# Patient Record
Sex: Female | Born: 1985 | Race: White | Hispanic: No | Smoking: Current every day smoker
Health system: Southern US, Community
[De-identification: ages and names within clinical notes are randomized; demographics above are authoritative.]

## PROBLEM LIST (undated history)

## (undated) DIAGNOSIS — R0789 Other chest pain: Secondary | ICD-10-CM

## (undated) DIAGNOSIS — E119 Type 2 diabetes mellitus without complications: Secondary | ICD-10-CM

## (undated) DIAGNOSIS — R5383 Other fatigue: Secondary | ICD-10-CM

## (undated) DIAGNOSIS — F99 Mental disorder, not otherwise specified: Secondary | ICD-10-CM

## (undated) DIAGNOSIS — I10 Essential (primary) hypertension: Secondary | ICD-10-CM

## (undated) DIAGNOSIS — K219 Gastro-esophageal reflux disease without esophagitis: Secondary | ICD-10-CM

## (undated) DIAGNOSIS — F419 Anxiety disorder, unspecified: Secondary | ICD-10-CM

## (undated) HISTORY — DX: Other chest pain: R07.89

## (undated) HISTORY — DX: Gastro-esophageal reflux disease without esophagitis: K21.9

## (undated) HISTORY — DX: Other fatigue: R53.83

---
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
## 2017-01-09 NOTE — L&D Delivery Note (Addendum)
Patient: Cassandra Marsh MRN: [id number]  GBS status: Negative  Patient is a 32 y.o. now G1P1001 s/p NSVD at [redacted]w[redacted]d, who was admitted for IOL due to [facility] and A2GDM. AROM 8h 54m prior to delivery with clear fluid.    Delivery Note At 11:19 PM a viable female was delivered via Vaginal, Spontaneous (Presentation: L OA).  APGAR: 8, 9; weight pending.   Placenta status: Spontaneous, intact Cord: 3 vessel, shorter than expected with the following complications: none.    Anesthesia: Epidural  Episiotomy: No Lacerations: None   Suture Repair: none Est. Blood Loss (mL): 314  Head delivered left OA. No nuchal cord present. Shoulder and body delivered in usual fashion. Infant with spontaneous cry, placed on mother's abdomen, dried and bulb suctioned. Cord clamped x 2 after 1-minute delay, and cut by family member. Cord blood drawn. Placenta delivered spontaneously with gentle cord traction. Fundus firm with massage and Pitocin. Perineum inspected and found to have no lacerations. Continued to have slow tickle of blood, provided TXA. Cessation of bleeding prior to leaving room.    Mom to postpartum.  Baby to Couplet care / Skin to Skin.  Allayne Stack 11/16/2017, 12:08 AM   OB FELLOW DELIVERY ATTESTATION  I was gloved and present for the delivery in its entirety, and I agree with the above resident's note.    Gwenevere Abbot, MD OB Fellow  11/16/2017, 4:58 AM

## 2017-05-22 ENCOUNTER — Ambulatory Visit (INDEPENDENT_AMBULATORY_CARE_PROVIDER_SITE_OTHER): Payer: Medicaid Other | Admitting: Obstetrics & Gynecology

## 2017-05-22 ENCOUNTER — Encounter: Payer: Self-pay | Admitting: Obstetrics & Gynecology

## 2017-05-22 ENCOUNTER — Other Ambulatory Visit (HOSPITAL_COMMUNITY)
Admission: RE | Admit: 2017-05-22 | Discharge: 2017-05-22 | Disposition: A | Discharge status: Home / Self Care | Payer: Medicaid Other | Source: Ambulatory Visit | Attending: Obstetrics & Gynecology | Admitting: Obstetrics & Gynecology

## 2017-05-22 DIAGNOSIS — Z3400 Encounter for supervision of normal first pregnancy, unspecified trimester: Secondary | ICD-10-CM | POA: Diagnosis present

## 2017-05-22 DIAGNOSIS — Z3401 Encounter for supervision of normal first pregnancy, first trimester: Secondary | ICD-10-CM | POA: Insufficient documentation

## 2017-05-22 DIAGNOSIS — Z3481 Encounter for supervision of other normal pregnancy, first trimester: Secondary | ICD-10-CM

## 2017-05-22 MED ORDER — PROMETHAZINE HCL 12.5 MG PO TABS: 12.5000 mg | ORAL_TABLET | Freq: Three times a day (TID) | ORAL | 1 refills | Status: DC | PRN

## 2017-05-22 NOTE — Progress Notes (Signed)
Patient is in the office for initial ob visit, unplanned pregnancy, unsure of fob. Pt states her friend Ethelene Browns is her support person.

## 2017-05-22 NOTE — Progress Notes (Signed)
  Subjective:    Cassandra Marsh is a 32 yo single G1P0 [redacted]w[redacted]d being seen today for her first obstetrical visit.  Her obstetrical history is significant for none. Patient does intend to breast feed. Pregnancy history fully reviewed.  Patient reports no complaints.  There were no vitals filed for this visit.  HISTORY: OB History  Gravida Para Term Preterm AB Living  1            SAB TAB Ectopic Multiple Live Births               # Outcome Date GA Lbr Len/2nd Weight Sex Delivery Anes PTL Lv  1 Current            History reviewed. No pertinent past medical history. Past Surgical History:  Procedure Laterality Date  . TONSILLECTOMY  2004   History reviewed. No pertinent family history.   Exam    Uterus:     Pelvic Exam:    Perineum: No Hemorrhoids   Vulva: normal   Vagina:  normal mucosa   pH:    Cervix: anteverted   Adnexa: normal adnexa   Bony Pelvis: android  System: Breast:  normal appearance, no masses or tenderness   Skin: normal coloration and turgor, no rashes    Neurologic: oriented   Extremities: normal strength, tone, and muscle mass   HEENT PERRLA   Mouth/Teeth mucous membranes moist, pharynx normal without lesions   Neck supple   Cardiovascular: regular rate and rhythm   Respiratory:  appears well, vitals normal, no respiratory distress, acyanotic, normal RR, ear and throat exam is normal, neck free of mass or lymphadenopathy, chest clear, no wheezing, crepitations, rhonchi, normal symmetric air entry   Abdomen: soft, non-tender; bowel sounds normal; no masses,  no organomegaly   Urinary: urethral meatus normal      Assessment:    Pregnancy: G1P0 Patient Active Problem List   Diagnosis Date Noted  . Supervision of normal first pregnancy, antepartum 05/22/2017        Plan:     Initial labs drawn. Prenatal vitamins. Problem list reviewed and updated. Genetic Screening discussed: opts for Panorama, to be done today  Ultrasound discussed; fetal  survey: ordered.  Follow up in 8 weeks. She will do [employer] (optimized schedule) Phenergan prn for mild nausea   Allie Bossier 05/22/2017

## 2017-05-24 LAB — CYTOLOGY - PAP
ADEQUACY: ABSENT
Chlamydia: NEGATIVE
DIAGNOSIS: NEGATIVE
HPV (WINDOPATH): NOT DETECTED
NEISSERIA GONORRHEA: NEGATIVE

## 2017-05-25 LAB — OBSTETRIC PANEL, INCLUDING HIV
Antibody Screen: NEGATIVE
Basophils Absolute: 0 10*3/uL (ref 0.0–0.2)
Basos: 0 %
EOS (ABSOLUTE): 0.1 10*3/uL (ref 0.0–0.4)
Eos: 1 %
HIV Screen 4th Generation wRfx: NONREACTIVE
Hematocrit: 43.4 % (ref 34.0–46.6)
Hemoglobin: 14.7 g/dL (ref 11.1–15.9)
Hepatitis B Surface Ag: NEGATIVE
Immature Grans (Abs): 0 10*3/uL (ref 0.0–0.1)
Immature Granulocytes: 0 %
Lymphocytes Absolute: 2 10*3/uL (ref 0.7–3.1)
Lymphs: 19 %
MCH: 31.7 pg (ref 26.6–33.0)
MCHC: 33.9 g/dL (ref 31.5–35.7)
MCV: 94 fL (ref 79–97)
Monocytes Absolute: 0.4 10*3/uL (ref 0.1–0.9)
Monocytes: 4 %
Neutrophils Absolute: 8.2 10*3/uL — ABNORMAL HIGH (ref 1.4–7.0)
Neutrophils: 76 %
Platelets: 311 10*3/uL (ref 150–379)
RBC: 4.63 x10E6/uL (ref 3.77–5.28)
RDW: 13.1 % (ref 12.3–15.4)
RPR Ser Ql: NONREACTIVE
Rh Factor: POSITIVE
Rubella Antibodies, IGG: 3.29 index (ref >0.99)
WBC: 10.7 10*3/uL (ref 3.4–10.8)

## 2017-05-25 LAB — CULTURE, OB URINE

## 2017-05-25 LAB — HEMOGLOBINOPATHY EVALUATION
HGB C: 0 %
HGB S: 0 %
HGB VARIANT: 0 %
Hemoglobin A2 Quantitation: 2.2 % (ref 1.8–3.2)
Hemoglobin F Quantitation: 0 % (ref 0.0–2.0)
Hgb A: 97.8 % (ref 96.4–98.8)

## 2017-05-25 LAB — URINE CULTURE, OB REFLEX

## 2017-05-28 ENCOUNTER — Telehealth: Payer: Self-pay

## 2017-05-28 NOTE — Telephone Encounter (Signed)
Left VM message to call office.  Need to verify receipt of BRX Kit and activation.

## 2017-05-28 NOTE — Telephone Encounter (Signed)
-----  Message from Gretchen Short, [place] sent at 05/28/2017  9:46 AM EDT ----- Pt has completed her BRx enrollment but needs to activate equipment. Please call her to see if she has received her kit.   Thanks [employer]

## 2017-05-28 NOTE — Telephone Encounter (Signed)
Patient returned our call, she advised [place] that she just received her Kit today. She promised to activate and use.

## 2017-05-29 ENCOUNTER — Encounter: Payer: Self-pay | Admitting: Obstetrics & Gynecology

## 2017-05-29 LAB — CYSTIC FIBROSIS MUTATION 97: GENE DIS ANAL CARRIER INTERP BLD/T-IMP: NOT DETECTED

## 2017-05-29 NOTE — Addendum Note (Signed)
Addended by: Reva Bores on: 05/29/2017 05:09 PM   Modules accepted: Kipp Brood

## 2017-06-06 ENCOUNTER — Encounter: Payer: Self-pay | Admitting: Obstetrics & Gynecology

## 2017-06-17 ENCOUNTER — Encounter: Payer: Self-pay | Admitting: Obstetrics & Gynecology

## 2017-06-18 ENCOUNTER — Encounter: Payer: Self-pay | Admitting: Obstetrics & Gynecology

## 2017-06-19 ENCOUNTER — Encounter: Payer: Self-pay | Admitting: Obstetrics & Gynecology

## 2017-06-19 ENCOUNTER — Ambulatory Visit (INDEPENDENT_AMBULATORY_CARE_PROVIDER_SITE_OTHER): Payer: Medicaid Other | Admitting: Obstetrics & Gynecology

## 2017-06-19 VITALS — BP 124/85 | HR 94 | Wt 121.8 lb

## 2017-06-19 DIAGNOSIS — O99330 Smoking (tobacco) complicating pregnancy, unspecified trimester: Secondary | ICD-10-CM | POA: Insufficient documentation

## 2017-06-19 DIAGNOSIS — F17200 Nicotine dependence, unspecified, uncomplicated: Secondary | ICD-10-CM | POA: Diagnosis not present

## 2017-06-19 DIAGNOSIS — O99332 Smoking (tobacco) complicating pregnancy, second trimester: Secondary | ICD-10-CM | POA: Diagnosis not present

## 2017-06-19 DIAGNOSIS — Z3400 Encounter for supervision of normal first pregnancy, unspecified trimester: Secondary | ICD-10-CM

## 2017-06-19 DIAGNOSIS — Z3402 Encounter for supervision of normal first pregnancy, second trimester: Secondary | ICD-10-CM

## 2017-06-19 NOTE — Progress Notes (Signed)
Pt complains of not feeling fetal movement for the last 4 days.

## 2017-06-19 NOTE — Patient Instructions (Signed)
Coping with Quitting Smoking Quitting smoking is a physical and mental challenge. You will face cravings, withdrawal symptoms, and temptation. Before quitting, work with your health care provider to make a plan that can help you cope. Preparation can help you quit and keep you from giving in. How can I cope with cravings? Cravings usually last for 5-10 minutes. If you get through it, the craving will pass. Consider taking the following actions to help you cope with cravings:  Keep your mouth busy: ? Chew sugar-free gum. ? Suck on hard candies or a straw. ? Brush your teeth.  Keep your hands and body busy: ? Immediately change to a different activity when you feel a craving. ? Squeeze or play with a ball. ? Do an activity or a hobby, like making bead jewelry, practicing needlepoint, or working with wood. ? Mix up your normal routine. ? Take a short exercise break. Go for a quick walk or run up and down stairs. ? Spend time in public places where smoking is not allowed.  Focus on doing something kind or helpful for someone else.  Call a friend or family member to talk during a craving.  Join a support group.  Call a quit line, such as 1-800-QUIT-NOW.  Talk with your health care provider about medicines that might help you cope with cravings and make quitting easier for you.  How can I deal with withdrawal symptoms? Your body may experience negative effects as it tries to get used to not having nicotine in the system. These effects are called withdrawal symptoms. They may include:  Feeling hungrier than normal.  Trouble concentrating.  Irritability.  Trouble sleeping.  Feeling depressed.  Restlessness and agitation.  Craving a cigarette.  To manage withdrawal symptoms:  Avoid places, people, and activities that trigger your cravings.  Remember why you want to quit.  Get plenty of sleep.  Avoid coffee and other caffeinated drinks. These may worsen some of your  symptoms.  How can I handle social situations? Social situations can be difficult when you are quitting smoking, especially in the first few weeks. To manage this, you can:  Avoid parties, bars, and other social situations where people might be smoking.  Avoid alcohol.  Leave right away if you have the urge to smoke.  Explain to your family and friends that you are quitting smoking. Ask for understanding and support.  Plan activities with friends or family where smoking is not an option.  What are some ways I can cope with stress? Wanting to smoke may cause stress, and stress can make you want to smoke. Find ways to manage your stress. Relaxation techniques can help. For example:  Breathe slowly and deeply, in through your nose and out through your mouth.  Listen to soothing, relaxing music.  Talk with a family member or friend about your stress.  Light a candle.  Soak in a bath or take a shower.  Think about a peaceful place.  What are some ways I can prevent weight gain? Be aware that many people gain weight after they quit smoking. However, not everyone does. To keep from gaining weight, have a plan in place before you quit and stick to the plan after you quit. Your plan should include:  Having healthy snacks. When you have a craving, it may help to: ? Eat plain popcorn, crunchy carrots, celery, or other cut vegetables. ? Chew sugar-free gum.  Changing how you eat: ? Eat small portion sizes at meals. ?   Eat 4-6 small meals throughout the day instead of 1-2 large meals a day. ? Be mindful when you eat. Do not watch television or do other things that might distract you as you eat.  Exercising regularly: ? Make time to exercise each day. If you do not have time for a long workout, do short bouts of exercise for 5-10 minutes several times a day. ? Do some form of strengthening exercise, like weight lifting, and some form of aerobic exercise, like running or  swimming.  Drinking plenty of water or other low-calorie or no-calorie drinks. Drink 6-8 glasses of water daily, or as much as instructed by your health care provider.  Summary  Quitting smoking is a physical and mental challenge. You will face cravings, withdrawal symptoms, and temptation to smoke again. Preparation can help you as you go through these challenges.  You can cope with cravings by keeping your mouth busy (such as by chewing gum), keeping your body and hands busy, and making calls to family, friends, or a helpline for people who want to quit smoking.  You can cope with withdrawal symptoms by avoiding places where people smoke, avoiding drinks with caffeine, and getting plenty of rest.  Ask your health care provider about the different ways to prevent weight gain, avoid stress, and handle social situations. This information is not intended to replace advice given to you by your health care provider. Make sure you discuss any questions you have with your health care provider. Document Released: 12/24/2015 Document Revised: 12/24/2015 Document Reviewed: 12/24/2015 Elsevier Interactive Patient Education  2018 [employer]. Smoking During Pregnancy Smoking during pregnancy is unhealthy for you and your baby. Smoke from cigarettes, pipes, and cigars contains many chemicals that can cause cancer (carcinogens). Cigarettes also contain a stimulant drug (nicotine). When you smoke, harmful substances that you breathe in enter your bloodstream and can be passed on to your baby. This can affect your baby's development. If you are planning to become pregnant or have recently become pregnant, talk with your health care provider about quitting smoking. How does smoking affect me? Smoking increases your risk for many long-term (chronic) diseases. These diseases include cancer, lung diseases, and heart disease. Smoking during pregnancy increases your risk of:  Losing the pregnancy (miscarriage or  stillbirth).  Giving birth too early (premature birth).  Pregnancy outside of the uterus (tubal pregnancy).  Having problems with the organ that provides the baby nourishment and oxygen (placenta), including: ? Attachment of the placenta over the opening of the uterus (placenta previa). ? Detachment of the placenta before the baby's birth (placental abruption).  Having your water break before labor begins (premature rupture of membranes).  How does smoking affect my baby? Before Birth Smoking during pregnancy:  Decreases blood flow and oxygen to your baby.  Increases your baby's risk of birth defects, such as heart defects.  Increases your baby's heart rate.  Slows your baby's growth in the uterus (intrauterine growth retardation).  After Birth Babies born to women who smoked during pregnancy may:  Have symptoms of nicotine withdrawal.  Need to stay in the hospital for special care.  May be too small at birth.  Have a high risk of: ? Serious health problems or lifelong disabilities. ? Sudden infant death syndrome (SIDS). ? Becoming obese. ? Developing behavior or learning problems.  What can happen if changes are not made? When babies are born with a birth defect or illness, they often need to stay in the hospital longer before  going home. Hospital stays may also be longer if you had any complications during labor or delivery. Longer hospital stays and more treatments result in higher costs for health care. Many health issues among babies born to mothers who smoke can have a lifelong impact. This may include the long-term need for certain medicines, therapies, or other treatments. What are the benefits of not smoking during pregnancy? You have a much better chance of having a healthy pregnancy and a healthy baby if you do not smoke while you are pregnant. Not smoking also means that you will have a better chance of living a long and healthy life, and your baby will have a  better chance of growing into a healthy child and adult. What actions can be taken? Quitting smoking can be difficult. Ask your health care provider for help to stop smoking. You may also consider:  Counseling to help you quit smoking (smoking cessation counseling).  Psychotherapy.  Acupuncture.  Hypnosis.  Telephone [employer] Products[employer]IT hotlines.  If these methods do not help you, talk with your health care provider about other options. Do not take smoking cessation medicines or nicotine supplements unless your health care provider tells you to. Where to find more information: Learn more about smoking during pregnancy and quitting smoking from:  March of Dimes: www.marchofdimes.org/pregnancy/smoking-during-pregnancy.aspx  U.S. Department of Health and Human Services: women.smokefree.gov  American Cancer Society: www.cancer.org  American Heart Association: www.heart.org  National Cancer Institute: www.cancer.gov  For help to quit smoking:  National smoking cessation telephone hotline: 1-800-QUIT NOW 269-457-6300(872-494-3159)  Contact a health care provider if:  You are struggling to quit smoking.  You are a smoker and you become pregnant or plan to become pregnant.  You start smoking again after giving birth. Summary  Tobacco smoke contains harmful substances that can affect a baby's health and development.  Smoking increases the risk for serious problems, such as miscarriage, birth defects, or premature birth.  If you need help to quit smoking, ask your health care provider. This information is not intended to replace advice given to you by your health care provider. Make sure you discuss any questions you have with your health care provider. Document Released: 05/09/2004 Document Revised: 10/15/2015 Document Reviewed: 10/15/2015 Elsevier Interactive Patient Education  2018 [employer]

## 2017-06-19 NOTE — Progress Notes (Signed)
   PRENATAL VISIT NOTE  Subjective:  Cassandra Marsh is a 32 y.o. G1P0 at 169w6d being seen today for ongoing prenatal care.  Cassandra Marsh is currently monitored for the following issues for this high-risk pregnancy and has Supervision of normal first pregnancy, antepartum and Tobacco smoking affecting pregnancy, antepartum on their problem list.  Patient reports mid abdominal pain and decreased movement, irritible.  Contractions: Not present. Vag. Bleeding: None.  Movement: Present. Denies leaking of fluid.   The following portions of the patient's history were reviewed and updated as appropriate: allergies, current medications, past family history, past medical history, past social history, past surgical history and problem list. Problem list updated.  Objective:   Vitals:   06/19/17 1338  BP: 124/85  Pulse: 94  Weight: 121 lb 12.8 oz (55.2 kg)    Fetal Status: Fetal Heart Rate (bpm): 150   Movement: Present     General:  Alert, oriented and cooperative. Patient is in no acute distress.  Skin: Skin is warm and dry. No rash noted.   Cardiovascular: Normal heart rate noted  Respiratory: Normal respiratory effort, no problems with respiration noted  Abdomen: Soft, gravid, appropriate for gestational age.  Pain/Pressure: Absent     Pelvic: Cervical exam deferred        Extremities: Normal range of motion.  Edema: None  Mental Status: Normal mood and affect. Normal behavior. Normal judgment and thought content.   Assessment and Plan:  Pregnancy: G1P0 at 3469w6d  1. Supervision of normal first pregnancy, antepartum Reassurance given, advised her that mental health counseling is available  2. Tobacco smoking affecting pregnancy, antepartum Urged quit, materials given and 5 min counseling spent  Preterm labor symptoms and general obstetric precautions including but not limited to vaginal bleeding, contractions, leaking of fluid and fetal movement were reviewed in detail with the patient. Please  refer to After Visit Summary for other counseling recommendations.  Return in about 1 month (around 07/17/2017).  Future Appointments  Date Time Provider Department Center  07/11/2017  1:45 PM [facility] US 2 [facility]  07/17/2017  1:00 PM Conan Bowensavis, Kelly M, MD [facility] None    Scheryl DarterJames Rennee Coyne, MD

## 2017-07-02 ENCOUNTER — Encounter: Payer: Self-pay | Admitting: Obstetrics & Gynecology

## 2017-07-04 ENCOUNTER — Encounter (HOSPITAL_COMMUNITY): Payer: Self-pay

## 2017-07-08 ENCOUNTER — Encounter: Payer: Self-pay | Admitting: Obstetrics & Gynecology

## 2017-07-11 ENCOUNTER — Other Ambulatory Visit: Payer: Self-pay | Admitting: Obstetrics & Gynecology

## 2017-07-11 ENCOUNTER — Telehealth: Payer: Self-pay

## 2017-07-11 ENCOUNTER — Ambulatory Visit (HOSPITAL_COMMUNITY)
Admission: RE | Admit: 2017-07-11 | Discharge: 2017-07-11 | Disposition: A | Discharge status: Home / Self Care | Payer: Medicaid Other | Source: Ambulatory Visit | Attending: Obstetrics & Gynecology | Admitting: Obstetrics & Gynecology

## 2017-07-11 ENCOUNTER — Encounter: Payer: Self-pay | Admitting: Obstetrics & Gynecology

## 2017-07-11 ENCOUNTER — Other Ambulatory Visit (HOSPITAL_COMMUNITY): Payer: Self-pay | Admitting: *Deleted

## 2017-07-11 DIAGNOSIS — Z363 Encounter for antenatal screening for malformations: Secondary | ICD-10-CM

## 2017-07-11 DIAGNOSIS — O99332 Smoking (tobacco) complicating pregnancy, second trimester: Secondary | ICD-10-CM | POA: Insufficient documentation

## 2017-07-11 DIAGNOSIS — Z3A19 19 weeks gestation of pregnancy: Secondary | ICD-10-CM

## 2017-07-11 DIAGNOSIS — Z3400 Encounter for supervision of normal first pregnancy, unspecified trimester: Secondary | ICD-10-CM

## 2017-07-11 DIAGNOSIS — Z362 Encounter for other antenatal screening follow-up: Secondary | ICD-10-CM

## 2017-07-11 NOTE — Telephone Encounter (Signed)
This pt is a [facility] patient. [facility]. Pt has sent another message saying she is in pain and no one has responded. I called the pt but, she did not answer and her voicemail box was full.

## 2017-07-14 ENCOUNTER — Encounter: Payer: Self-pay | Admitting: Obstetrics & Gynecology

## 2017-07-16 ENCOUNTER — Telehealth: Payer: Self-pay

## 2017-07-16 NOTE — Telephone Encounter (Signed)
We are unable to leave message. Mailbox is full.   The following response was sent via MyChart.  Hi Ms Rush BarerGerber, We have tried on several occassions to reach you via telephone, but we are unable to leave a message as your Mailbox is full.  You have an appointment to see Dr. Earlene Plateravis 07/17/17 at our [facility] offices at 1:00 pm, she will be able to evaluate/treat your pain at this appointment.  You may take Tylenol extra strength for the pain until you are seen.

## 2017-07-16 NOTE — Telephone Encounter (Signed)
The following response was sent via MyChart to patient.  You might feel fluttering in the early stages of your pregnancy, however, movements should be more definitive at 22-24 weeks. Please schedule appointment to see us or if it is after hours [facility].

## 2017-07-17 ENCOUNTER — Ambulatory Visit (INDEPENDENT_AMBULATORY_CARE_PROVIDER_SITE_OTHER): Payer: Medicaid Other | Admitting: Obstetrics and Gynecology

## 2017-07-17 ENCOUNTER — Encounter: Payer: Self-pay | Admitting: Obstetrics & Gynecology

## 2017-07-17 ENCOUNTER — Encounter: Payer: Self-pay | Admitting: Obstetrics and Gynecology

## 2017-07-17 VITALS — BP 112/75 | HR 91 | Wt 130.6 lb

## 2017-07-17 DIAGNOSIS — O99332 Smoking (tobacco) complicating pregnancy, second trimester: Secondary | ICD-10-CM

## 2017-07-17 DIAGNOSIS — Z3402 Encounter for supervision of normal first pregnancy, second trimester: Secondary | ICD-10-CM

## 2017-07-17 DIAGNOSIS — Z3400 Encounter for supervision of normal first pregnancy, unspecified trimester: Secondary | ICD-10-CM

## 2017-07-17 DIAGNOSIS — R4586 Emotional lability: Secondary | ICD-10-CM

## 2017-07-17 DIAGNOSIS — O99330 Smoking (tobacco) complicating pregnancy, unspecified trimester: Secondary | ICD-10-CM

## 2017-07-17 NOTE — Progress Notes (Signed)
   PRENATAL VISIT NOTE  Subjective:  Cassandra Marsh is a 32 y.o. G1P0 at 4469w6d being seen today for ongoing prenatal care.  She is currently monitored for the following issues for this low-risk pregnancy and has Supervision of normal first pregnancy, antepartum and Tobacco smoking affecting pregnancy, antepartum on their problem list.  Patient reports minor cramping.  Contractions: Not present. Vag. Bleeding: None.  Movement: Present. Denies leaking of fluid. She does report labile mood, sometimes starts crying out of no where and then it passes quickly. Denies thoughts of self-harm, just wants to know if this is normal.  The following portions of the patient's history were reviewed and updated as appropriate: allergies, current medications, past family history, past medical history, past social history, past surgical history and problem list. Problem list updated.  Objective:   Vitals:   07/17/17 1304  BP: 112/75  Pulse: 91  Weight: 130 lb 9.6 oz (59.2 kg)    Fetal Status: Fetal Heart Rate (bpm): 145   Movement: Present     General:  Alert, oriented and cooperative. Patient is in no acute distress.  Skin: Skin is warm and dry. No rash noted.   Cardiovascular: Normal heart rate noted  Respiratory: Normal respiratory effort, no problems with respiration noted  Abdomen: Soft, gravid, appropriate for gestational age.  Pain/Pressure: Absent     Pelvic: Cervical exam deferred        Extremities: Normal range of motion.  Edema: None  Mental Status: Normal mood and affect. Normal behavior. Normal judgment and thought content.   Assessment and Plan:  Pregnancy: G1P0 at 6169w6d  1. Supervision of normal first pregnancy, antepartum Anatomy normal but incomplete, has appt for repeat us 7/31  2. Tobacco smoking affecting pregnancy, antepartum  3. Labile mood Labile mood swings, denies thoughts of self harm Reviewed s/s depression/anxiety, states she is excited about pregnancy, just having  mood swings, reviewed options for support and counseling, she declines today but will call if she needs to schedule appt   Preterm labor symptoms and general obstetric precautions including but not limited to vaginal bleeding, contractions, leaking of fluid and fetal movement were reviewed in detail with the patient. Please refer to After Visit Summary for other counseling recommendations.  Return in about 1 month (around 08/14/2017) for OB visit.  Future Appointments  Date Time Provider Department Center  08/08/2017  2:00 PM [facility] US 3 [facility]    Conan BowensKelly M Davis, MD

## 2017-07-17 NOTE — Patient Instructions (Signed)
Contraception Choices Contraception, also called birth control, refers to methods or devices that prevent pregnancy. Hormonal methods Contraceptive implant A contraceptive implant is a thin, plastic tube that contains a hormone. It is inserted into the upper part of the arm. It can remain in place for up to 3 years. Progestin-only injections Progestin-only injections are injections of progestin, a synthetic form of the hormone progesterone. They are given every 3 months by a health care provider. Birth control pills Birth control pills are pills that contain hormones that prevent pregnancy. They must be taken once a day, preferably at the same time each day. Birth control patch The birth control patch contains hormones that prevent pregnancy. It is placed on the skin and must be changed once a week for three weeks and removed on the fourth week. A prescription is needed to use this method of contraception. Vaginal ring A vaginal ring contains hormones that prevent pregnancy. It is placed in the vagina for three weeks and removed on the fourth week. After that, the process is repeated with a new ring. A prescription is needed to use this method of contraception. Emergency contraceptive Emergency contraceptives prevent pregnancy after unprotected sex. They come in pill form and can be taken up to 5 days after sex. They work best the sooner they are taken after having sex. Most emergency contraceptives are available without a prescription. This method should not be used as your only form of birth control. Barrier methods Female condom A female condom is a thin sheath that is worn over the penis during sex. Condoms keep sperm from going inside a woman's body. They can be used with a spermicide to increase their effectiveness. They should be disposed after a single use. Female condom A female condom is a soft, loose-fitting sheath that is put into the vagina before sex. The condom keeps sperm from going  inside a woman's body. They should be disposed after a single use.  Intrauterine contraception Intrauterine device (IUD) An IUD is a T-shaped device that is put in a woman's uterus. There are two types:  Hormone IUD.This type contains progestin, a synthetic form of the hormone progesterone. This type can stay in place for 3-5 years.  Copper IUD.This type is wrapped in copper wire. It can stay in place for 10 years.  Permanent methods of contraception Female tubal ligation In this method, a woman's fallopian tubes are sealed, tied, or blocked during surgery to prevent eggs from traveling to the uterus.  Female sterilization This is a procedure to tie off the tubes that carry sperm (vasectomy). After the procedure, the man can still ejaculate fluid (semen).  Summary  Contraception, also called birth control, means methods or devices that prevent pregnancy.  Hormonal methods of contraception include implants, injections, pills, patches, vaginal rings, and emergency contraceptives.  Barrier methods of contraception can include female condoms, female condoms, diaphragms, cervical caps, sponges, and spermicides.  There are two types of IUDs (intrauterine devices). An IUD can be put in a woman's uterus to prevent pregnancy for 3-5 years.  Permanent sterilization can be done through a procedure for males, females, or both. This information is not intended to replace advice given to you by your health care provider. Make sure you discuss any questions you have with your health care provider. Document Released: 12/26/2004 Document Revised: 01/29/2016 Document Reviewed: 01/29/2016 Elsevier Interactive Patient Education  2018 [employer] Inc.  

## 2017-08-08 ENCOUNTER — Ambulatory Visit (HOSPITAL_COMMUNITY)
Admission: RE | Admit: 2017-08-08 | Discharge: 2017-08-08 | Disposition: A | Discharge status: Home / Self Care | Payer: Medicaid Other | Source: Ambulatory Visit | Attending: Obstetrics & Gynecology | Admitting: Obstetrics & Gynecology

## 2017-08-08 DIAGNOSIS — O99332 Smoking (tobacco) complicating pregnancy, second trimester: Secondary | ICD-10-CM

## 2017-08-08 DIAGNOSIS — Z362 Encounter for other antenatal screening follow-up: Secondary | ICD-10-CM | POA: Insufficient documentation

## 2017-08-08 DIAGNOSIS — Z3A23 23 weeks gestation of pregnancy: Secondary | ICD-10-CM | POA: Insufficient documentation

## 2017-08-14 ENCOUNTER — Ambulatory Visit (INDEPENDENT_AMBULATORY_CARE_PROVIDER_SITE_OTHER): Payer: Medicaid Other | Admitting: Obstetrics and Gynecology

## 2017-08-14 ENCOUNTER — Encounter: Payer: Self-pay | Admitting: Obstetrics and Gynecology

## 2017-08-14 VITALS — BP 133/79 | HR 99 | Wt 135.8 lb

## 2017-08-14 DIAGNOSIS — F17210 Nicotine dependence, cigarettes, uncomplicated: Secondary | ICD-10-CM

## 2017-08-14 DIAGNOSIS — O99330 Smoking (tobacco) complicating pregnancy, unspecified trimester: Secondary | ICD-10-CM

## 2017-08-14 DIAGNOSIS — O99332 Smoking (tobacco) complicating pregnancy, second trimester: Secondary | ICD-10-CM

## 2017-08-14 DIAGNOSIS — Z3400 Encounter for supervision of normal first pregnancy, unspecified trimester: Secondary | ICD-10-CM

## 2017-08-14 DIAGNOSIS — Z3402 Encounter for supervision of normal first pregnancy, second trimester: Secondary | ICD-10-CM

## 2017-08-14 NOTE — Progress Notes (Signed)
Pt is G1P0 6060w6d here for ROB. Pt complains that her R elbow down to her hand is having "shooting pains"

## 2017-08-14 NOTE — Progress Notes (Signed)
   PRENATAL VISIT NOTE  Subjective:  Cassandra Marsh is a 32 y.o. G1P0 at 4134w6d being seen today for ongoing prenatal care.  She is currently monitored for the following issues for this low-risk pregnancy and has Supervision of normal first pregnancy, antepartum and Tobacco smoking affecting pregnancy, antepartum on their problem list.  Patient reports occasional round ligament pain. Swelling of hands.  Contractions: Not present. Vag. Bleeding: None.  Movement: Present. Denies leaking of fluid.   The following portions of the patient's history were reviewed and updated as appropriate: allergies, current medications, past family history, past medical history, past social history, past surgical history and problem list. Problem list updated.  Objective:   Vitals:   08/14/17 1404  BP: 133/79  Pulse: 99  Weight: 135 lb 12.8 oz (61.6 kg)    Fetal Status: Fetal Heart Rate (bpm): 145   Movement: Present     General:  Alert, oriented and cooperative. Patient is in no acute distress.  Skin: Skin is warm and dry. No rash noted.   Cardiovascular: Normal heart rate noted  Respiratory: Normal respiratory effort, no problems with respiration noted  Abdomen: Soft, gravid, appropriate for gestational age.  Pain/Pressure: Absent     Pelvic: Cervical exam deferred        Extremities: Normal range of motion.  Edema: Trace  Mental Status: Normal mood and affect. Normal behavior. Normal judgment and thought content.   Assessment and Plan:  Pregnancy: G1P0 at 7634w6d  1. Supervision of normal first pregnancy, antepartum  2. Tobacco smoking affecting pregnancy, antepartum Has cut down from 1 PPD to 5 cigs per day Smoking and tobacco cessation was discussed at today's visit for 4 minutes    Preterm labor symptoms and general obstetric precautions including but not limited to vaginal bleeding, contractions, leaking of fluid and fetal movement were reviewed in detail with the patient. Please refer to  After Visit Summary for other counseling recommendations.  Return in about 1 month (around 09/11/2017) for OB visit, 3rd trim labs, 2 hr GTT, Tdap.  Future Appointments  Date Time Provider Department Center  09/11/2017  9:00 AM [facility] None  09/11/2017  9:15 AM [facility] StaggersErvin, Michael L, MD [facility] None    Conan BowensKelly M Kenndra Morris, MD

## 2017-08-14 NOTE — Patient Instructions (Addendum)
AREA PEDIATRIC/FAMILY PRACTICE PHYSICIANS  [facility] [address], [address] [place], [place]  [address] Phone - [phone]   Fax - [phone]  [facility] [address] [place], [place] [address] Phone - [phone]   Fax - [phone]  [facility] [address] [place], [place]  [address] Phone - [phone]   Fax - [phone]  [facility] [address] [place], [place]  [address] Phone - [phone]   Fax - [phone]  [facility] [address] [place], [place]  [address] Phone - [phone]   Fax - [phone]  [facility] [address] [place], [place]  [address] Phone - [phone]   Fax - [phone]  [facility] [address] [place], [place]  [address] Phone - [phone]   Fax - [phone]  [facility] [address], [address] [place], [place]  [address] Phone - [phone]   Fax - [phone]  [facility] [address] [place], [place]  [address] Phone - [phone]   Fax - [phone] [facility] [address] [place], [place]  [address] Phone - [phone]   Fax - [phone]  [facility] [address] [place], [place]  [address] Phone - [phone]   Fax - [phone]  [facility] [address] [place], [place]  [address] Phone - [phone]   Fax - [phone]  [facility] [address] [place], [place]  [address] Phone - [phone]   Fax - [phone]  [facility] G[facility] [address] [place], [place]  [address] Phone - [phone]  [facility] [address] [place], [place]  [address] Phone - [phone]   Fax - [phone]  [facility] [address] [place], [place]  [address] Phone - [phone]   Fax - [phone]  [facility] [address] [place], [place]  [address] Phone - [phone]   Fax - [phone]  [facility] [address] [place], [place]  [address] Phone - [phone]   Fax - [phone]   [facility] [address] [place], [place]  [address] Phone - [phone]   Fax - [phone]  [facility] [address] [place], [place]  [address] Phone - [phone]   Fax - [phone]  [facility] [address] [place], [place]  [address] Phone - [phone]   Fax - [phone]  [facility] [address] , [place]  [address] Phone - [phone]   Fax - [phone]  [facility] [address] , [place]  [address] Phone - [phone]   Fax - [phone] [facility] [address] [place], [place]  [address] Phone - [phone]   Fax - [phone]  [facility] [address] [place], [place]  [address] Phone - [phone]   Fax - [phone]   [facility] [address] [place], [place]  [address] Phone - [phone]   Fax - [phone]  [facility] Charlene Flemming MD [address] [place]  [address] Phone [phone]  Fax [phone] Places to have your son circumcised:    [facility] [phone] while you are   in hospital  [facility] [phone] $[phone] by 4 wks  [facility] [phone] $[phone] by 2 wks  [facility] [phone] $[phone] by 7 days [facility] [phone] $[phone] by 4 wks  These prices sometimes change but are roughly what you can expect to pay. Please  call and confirm pricing.   Circumcision is considered an elective/non-medically necessary procedure. There are many reasons parents decide to have their sons circumsized. During the first year of life circumcised males have a reduced risk of urinary tract infections but after this year the rates between circumcised males and uncircumcised males are the same.  It is safe to have your son circumcised outside of the hospital and the places above perform them regularly.   Deciding about Circumcision in Baby Boys  (Up-to-date The Basics)  What is circumcision?  Circumcision is a surgery that removes the skin that covers the tip of the penis, called the "foreskin" Circumcision is usually done when a boy is between 1 and 10 days old. In the [place], circumcision is common. In some other countries, fewer boys are circumcised. Circumcision is a common tradition in some religions.  Should I have my baby boy circumcised?  There is no easy answer. Circumcision has some benefits. But it also has risks. After talking with your doctor, you will have to decide for yourself what is right for your family.  What are the benefits of circumcision?  Circumcised boys seem to have slightly lower rates of: ?Urinary tract infections ?Swelling of the opening at the tip of the penis Circumcised men seem to have slightly lower rates of: ?Urinary tract infections ?Swelling of the opening at the tip of the penis ?Penis cancer ?HIV and other infections that you catch during sex ?Cervical cancer in the women they have sex with Even so, in the [place], the risks of these problems are small - even in boys and men who have not been circumcised. Plus, boys and men who are not circumcised can reduce these extra risks by: ?Cleaning their penis well ?Using condoms during sex  What are the risks of circumcision?  Risks include: ?Bleeding or infection from the surgery ?Damage to or amputation of the  penis ?A chance that the doctor will cut off too much or not enough of the foreskin ?A chance that sex won't feel as good later in life Only about 1 out of every 200 circumcisions leads to problems. There is also a chance that your health insurance won't pay for circumcision.  How is circumcision done in baby boys?  First, the baby gets medicine for pain relief. This might be a cream on the skin or a shot into the base of the penis. Next, the doctor cleans the baby's penis well. Then he or she uses special tools to cut off the foreskin. Finally, the doctor wraps a bandage (called gauze) around the baby's penis. If you have your baby circumcised, his doctor or nurse will give you instructions on how to care for him after the surgery. It is important that you follow those instructions carefully.  

## 2017-09-01 ENCOUNTER — Emergency Department
Admission: EM | Admit: 2017-09-01 | Discharge: 2017-09-01 | Disposition: A | Discharge status: Home / Self Care | Payer: Medicaid Other | Attending: Emergency Medicine | Admitting: Emergency Medicine

## 2017-09-01 ENCOUNTER — Other Ambulatory Visit: Payer: Self-pay

## 2017-09-01 ENCOUNTER — Encounter: Payer: Self-pay | Admitting: Emergency Medicine

## 2017-09-01 DIAGNOSIS — O26812 Pregnancy related exhaustion and fatigue, second trimester: Secondary | ICD-10-CM | POA: Insufficient documentation

## 2017-09-01 DIAGNOSIS — O99332 Smoking (tobacco) complicating pregnancy, second trimester: Secondary | ICD-10-CM | POA: Diagnosis not present

## 2017-09-01 DIAGNOSIS — R55 Syncope and collapse: Secondary | ICD-10-CM | POA: Diagnosis not present

## 2017-09-01 DIAGNOSIS — Z3A27 27 weeks gestation of pregnancy: Secondary | ICD-10-CM | POA: Insufficient documentation

## 2017-09-01 DIAGNOSIS — F17210 Nicotine dependence, cigarettes, uncomplicated: Secondary | ICD-10-CM | POA: Insufficient documentation

## 2017-09-01 LAB — CBC
HCT: 38.9 % (ref 35.0–47.0)
HEMOGLOBIN: 13.4 g/dL (ref 12.0–16.0)
MCH: 31.9 pg (ref 26.0–34.0)
MCHC: 34.4 g/dL (ref 32.0–36.0)
MCV: 92.9 fL (ref 80.0–100.0)
Platelets: 340 10*3/uL (ref 150–440)
RBC: 4.19 MIL/uL (ref 3.80–5.20)
RDW: 13.1 % (ref 11.5–14.5)
WBC: 19.8 10*3/uL — ABNORMAL HIGH (ref 3.6–11.0)

## 2017-09-01 LAB — COMPREHENSIVE METABOLIC PANEL
ALT: 15 U/L (ref 0–44)
AST: 19 U/L (ref 15–41)
Albumin: 3.5 g/dL (ref 3.5–5.0)
Alkaline Phosphatase: 77 U/L (ref 38–126)
Anion gap: 10 (ref 5–15)
BUN: 5 mg/dL — ABNORMAL LOW (ref 6–20)
CHLORIDE: 104 mmol/L (ref 98–111)
CO2: 23 mmol/L (ref 22–32)
CREATININE: 0.47 mg/dL (ref 0.44–1.00)
Calcium: 9.4 mg/dL (ref 8.9–10.3)
GFR calc Af Amer: >60 mL/min (ref >60)
GFR calc non Af Amer: >60 mL/min (ref >60)
Glucose, Bld: 120 mg/dL — ABNORMAL HIGH (ref 70–99)
Potassium: 3.8 mmol/L (ref 3.5–5.1)
SODIUM: 137 mmol/L (ref 135–145)
Total Bilirubin: 0.5 mg/dL (ref 0.3–1.2)
Total Protein: 7.1 g/dL (ref 6.5–8.1)

## 2017-09-01 LAB — TROPONIN I: Troponin I: <0.03 ng/mL (ref <0.03)

## 2017-09-01 NOTE — Discharge Instructions (Addendum)
Please drink plenty of fluids obtain plenty of rest over the next 1 to 2 days.  Please follow-up with your OB on Monday and let them know of your symptoms over the weekend.  Return to the emergency department for any return of symptoms, development of any chest pain, trouble breathing or if you feel like you might pass out.

## 2017-09-01 NOTE — ED Provider Notes (Signed)
[facility] Emergency Department Provider Note  Time seen: 10:22 PM  I have reviewed the triage vital signs and the nursing notes.   HISTORY  Chief Complaint Numbness    HPI Cassandra Marsh is a 32 y.o. female G1, P0 at [redacted] weeks pregnant presents to the emergency department with a near syncopal episode.  According to the patient for the past few months she has intermittently been getting numbness/tingling in her right hand.  States she has been told that it is "carpal tunnel syndrome of pregnancy".  She states she was upstairs visiting a friend who in just had a baby this morning.  She states she was holding the baby when the friend was describing the labor process, patient states she was feeling the numbness tingling in her hands so she gave the baby to somebody else however shortly after she began feeling sweaty and lightheaded like she might pass out.  States she sat down and the nurse fanned her off and provided her some juice, she felt better but decided to come down to get checked out.  Patient denies any chest pain or shortness of breath at any point.  Denies any headache denies any recent fever.  No abdominal pain fluid leakage or discharge.  Patient admits that she was having a lot of anxiety as they were talking about the labor process, and thinks she might of just had a panic attack.   History reviewed. No pertinent past medical history.  Patient Active Problem List   Diagnosis Date Noted  . Tobacco smoking affecting pregnancy, antepartum 06/19/2017  . Supervision of normal first pregnancy, antepartum 05/22/2017    Past Surgical History:  Procedure Laterality Date  . TONSILLECTOMY  2004    Prior to Admission medications   Medication Sig Start Date End Date Taking? Authorizing Provider  Prenatal Vit-Fe Fumarate-FA (PRENATAL MULTIVITAMIN) TABS tablet Take 1 tablet by mouth daily at 12 noon.    [provider]  promethazine (PHENERGAN) 12.5 MG  tablet Take 1 tablet (12.5 mg total) by mouth every 8 (eight) hours as needed for nausea or vomiting. Patient not taking: Reported on 08/14/2017 05/22/17   Allie Bossier, MD    No Known Allergies  No family history on file.  Social History Social History   Tobacco Use  . Smoking status: Current Every Day Smoker    Packs/day: 0.25    Types: Cigarettes  . Smokeless tobacco: Never Used  Substance Use Topics  . Alcohol use: Not Currently  . Drug use: Not Currently    Review of Systems Constitutional: Negative for fever.  Positive for lightheadedness, now resolved Cardiovascular: Negative for chest pain. Respiratory: Negative for shortness of breath. Gastrointestinal: Negative for abdominal pain, vomiting  Genitourinary: Negative for urinary compaints Musculoskeletal: Negative for musculoskeletal complaints Skin: Negative for skin complaints  Neurological: Negative for headache All other ROS negative  ____________________________________________   PHYSICAL EXAM:  VITAL SIGNS: ED Triage Vitals  Enc Vitals Group     BP 09/01/17 2124 114/70     Pulse Rate 09/01/17 2124 82     Resp 09/01/17 2124 18     Temp 09/01/17 2124 97.7 F (36.5 C)     Temp Source 09/01/17 2124 Oral     SpO2 09/01/17 2124 100 %     Weight 09/01/17 2125 140 lb (63.5 kg)     Height 09/01/17 2125 5\' 2"  (1.575 m)     Head Circumference --      Peak Flow --  Pain Score 09/01/17 2125 1     Pain Loc --      Pain Edu? --      Excl. in GC? --    Constitutional: Alert and oriented. Well appearing and in no distress. Eyes: Normal exam ENT   Head: Normocephalic and atraumatic.   Mouth/Throat: Mucous membranes are moist. Cardiovascular: Normal rate, regular rhythm. No murmur Respiratory: Normal respiratory effort without tachypnea nor retractions. Breath sounds are clear  Gastrointestinal: Soft and nontender. No distention. Musculoskeletal: Nontender with normal range of motion in all  extremities. Neurologic:  Normal speech and language. No gross focal neurologic deficits  Skin:  Skin is warm, dry and intact.  Psychiatric: Mood and affect are normal.   ____________________________________________    EKG  EKG reviewed and interpreted by myself shows normal sinus rhythm at 78 bpm with a narrow QRS, normal axis, normal intervals no concerning ST changes.  ____________________________________________   INITIAL IMPRESSION / ASSESSMENT AND PLAN / ED COURSE  Pertinent labs & imaging results that were available during my care of the patient were reviewed by me and considered in my medical decision making (see chart for details).  Patient presents to the emergency department for near syncope.  Differential would include ACS, letter light or metabolic abnormality, dehydration, anxiety, vasovagal effect, decreased venous return.  Overall the patient appears very well denies any symptoms since arrival to the emergency department.  States she is feeling back to normal.  We will check labs and continue to closely monitor.  At this time the patient has a normal physical examination feels better with reassuring vitals.  Patient's labs have all resulted largely within normal limits besides a moderate leukocytosis which could be explained with pregnancy and stress response if this was panic induced.  As the patient appears well I believe the patient is safe for discharge home we will have the patient follow-up with her OB.  I discussed return precautions for any further similar symptoms.  ____________________________________________   FINAL CLINICAL IMPRESSION(S) / ED DIAGNOSES  Near syncope    Minna AntisPaduchowski, Synthia Fairbank, MD 09/01/17 2242

## 2017-09-01 NOTE — ED Triage Notes (Signed)
First Nurse Note:  Patient had a near syncopal event while up on Women's post partum floor and holding a baby.  Patient is [redacted] weeks pregnant.  EDC:  12/05/2017.  Currently AAOx3. Skin warm and dry. NAD

## 2017-09-01 NOTE — ED Notes (Signed)
Patient states she feels the baby move as usual.

## 2017-09-01 NOTE — ED Triage Notes (Signed)
States approx 1 hour had episode for few minutes of R arm numbness. States became diaphoretic at time. States has had R hand numbness x 1 month. Arm numbness has resolved but hand numbness continues. Smile symmetrical, grips and leg strength equal. [redacted] weeks pregnant with no bleeding, fluid or cramps.

## 2017-09-11 ENCOUNTER — Encounter: Payer: Self-pay | Admitting: Obstetrics and Gynecology

## 2017-09-11 ENCOUNTER — Other Ambulatory Visit: Payer: Medicaid Other

## 2017-09-11 ENCOUNTER — Ambulatory Visit (INDEPENDENT_AMBULATORY_CARE_PROVIDER_SITE_OTHER): Payer: Medicaid Other | Admitting: Obstetrics and Gynecology

## 2017-09-11 VITALS — BP 159/77 | HR 118 | Wt 143.0 lb

## 2017-09-11 DIAGNOSIS — Z3402 Encounter for supervision of normal first pregnancy, second trimester: Secondary | ICD-10-CM

## 2017-09-11 DIAGNOSIS — Z3400 Encounter for supervision of normal first pregnancy, unspecified trimester: Secondary | ICD-10-CM

## 2017-09-11 NOTE — Progress Notes (Signed)
Subjective:  Cassandra Marsh is a 32 y.o. G1P0 at [redacted]w[redacted]d being seen today for ongoing prenatal care.  She is currently monitored for the following issues for this low-risk pregnancy and has Supervision of normal first pregnancy, antepartum and Tobacco smoking affecting pregnancy, antepartum on their problem list.  Patient reports carpal tunnel symptoms. Pt passed out last week while visiting with a new baby. Seen in ER. No problems since. Contractions: Not present. Vag. Bleeding: None, Small.  Movement: Present. Denies leaking of fluid.   The following portions of the patient's history were reviewed and updated as appropriate: allergies, current medications, past family history, past medical history, past social history, past surgical history and problem list. Problem list updated.  Objective:   Vitals:   09/11/17 0921  BP: (!) 159/77  Pulse: (!) 118  Weight: 143 lb (64.9 kg)    Fetal Status: Fetal Heart Rate (bpm): 148   Movement: Present     General:  Alert, oriented and cooperative. Patient is in no acute distress.  Skin: Skin is warm and dry. No rash noted.   Cardiovascular: Normal heart rate noted  Respiratory: Normal respiratory effort, no problems with respiration noted  Abdomen: Soft, gravid, appropriate for gestational age. Pain/Pressure: Present     Pelvic:  Cervical exam deferred        Extremities: Normal range of motion.  Edema: Trace  Mental Status: Normal mood and affect. Normal behavior. Normal judgment and thought content.   Urinalysis:      Assessment and Plan:  Pregnancy: G1P0 at [redacted]w[redacted]d  1. Supervision of normal first pregnancy, antepartum Stable Pt anxious about blood draws for glucola. BP reflects.  - Glucose Tolerance, 2 Hours w/1 Hour - CBC - RPR - HIV antibody  Preterm labor symptoms and general obstetric precautions including but not limited to vaginal bleeding, contractions, leaking of fluid and fetal movement were reviewed in detail with the  patient. Please refer to After Visit Summary for other counseling recommendations.  Return in about 2 weeks (around 09/25/2017) for OB visit.   Hermina Staggers, MD

## 2017-09-11 NOTE — Patient Instructions (Signed)
Third Trimester of Pregnancy The third trimester is from week 28 through week 40 (months 7 through 9). The third trimester is a time when the unborn baby (fetus) is growing rapidly. At the end of the ninth month, the fetus is about 20 inches in length and weighs 6-10 pounds. Body changes during your third trimester Your body will continue to go through many changes during pregnancy. The changes vary from woman to woman. During the third trimester:  Your weight will continue to increase. You can expect to gain 25-35 pounds (11-16 kg) by the end of the pregnancy.  You may begin to get stretch marks on your hips, abdomen, and breasts.  You may urinate more often because the fetus is moving lower into your pelvis and pressing on your bladder.  You may develop or continue to have heartburn. This is caused by increased hormones that slow down muscles in the digestive tract.  You may develop or continue to have constipation because increased hormones slow digestion and cause the muscles that push waste through your intestines to relax.  You may develop hemorrhoids. These are swollen veins (varicose veins) in the rectum that can itch or be painful.  You may develop swollen, bulging veins (varicose veins) in your legs.  You may have increased body aches in the pelvis, back, or thighs. This is due to weight gain and increased hormones that are relaxing your joints.  You may have changes in your hair. These can include thickening of your hair, rapid growth, and changes in texture. Some women also have hair loss during or after pregnancy, or hair that feels dry or thin. Your hair will most likely return to normal after your baby is born.  Your breasts will continue to grow and they will continue to become tender. A yellow fluid (colostrum) may leak from your breasts. This is the first milk you are producing for your baby.  Your belly button may stick out.  You may notice more swelling in your hands,  face, or ankles.  You may have increased tingling or numbness in your hands, arms, and legs. The skin on your belly may also feel numb.  You may feel short of breath because of your expanding uterus.  You may have more problems sleeping. This can be caused by the size of your belly, increased need to urinate, and an increase in your body's metabolism.  You may notice the fetus "dropping," or moving lower in your abdomen (lightening).  You may have increased vaginal discharge.  You may notice your joints feel loose and you may have pain around your pelvic bone.  What to expect at prenatal visits You will have prenatal exams every 2 weeks until week 36. Then you will have weekly prenatal exams. During a routine prenatal visit:  You will be weighed to make sure you and the baby are growing normally.  Your blood pressure will be taken.  Your abdomen will be measured to track your baby's growth.  The fetal heartbeat will be listened to.  Any test results from the previous visit will be discussed.  You may have a cervical check near your due date to see if your cervix has softened or thinned (effaced).  You will be tested for Group B streptococcus. This happens between 35 and 37 weeks.  Your health care provider may ask you:  What your birth plan is.  How you are feeling.  If you are feeling the baby move.  If you have had   any abnormal symptoms, such as leaking fluid, bleeding, severe headaches, or abdominal cramping.  If you are using any tobacco products, including cigarettes, chewing tobacco, and electronic cigarettes.  If you have any questions.  Other tests or screenings that may be performed during your third trimester include:  Blood tests that check for low iron levels (anemia).  Fetal testing to check the health, activity level, and growth of the fetus. Testing is done if you have certain medical conditions or if there are problems during the  pregnancy.  Nonstress test (NST). This test checks the health of your baby to make sure there are no signs of problems, such as the baby not getting enough oxygen. During this test, a belt is placed around your belly. The baby is made to move, and its heart rate is monitored during movement.  What is false labor? False labor is a condition in which you feel small, irregular tightenings of the muscles in the womb (contractions) that usually go away with rest, changing position, or drinking water. These are called Braxton Hicks contractions. Contractions may last for hours, days, or even weeks before true labor sets in. If contractions come at regular intervals, become more frequent, increase in intensity, or become painful, you should see your health care provider. What are the signs of labor?  Abdominal cramps.  Regular contractions that start at 10 minutes apart and become stronger and more frequent with time.  Contractions that start on the top of the uterus and spread down to the lower abdomen and back.  Increased pelvic pressure and dull back pain.  A watery or bloody mucus discharge that comes from the vagina.  Leaking of amniotic fluid. This is also known as your "water breaking." It could be a slow trickle or a gush. Let your health care provider know if it has a color or strange odor. If you have any of these signs, call your health care provider right away, even if it is before your due date. Follow these instructions at home: Medicines  Follow your health care provider's instructions regarding medicine use. Specific medicines may be either safe or unsafe to take during pregnancy.  Take a prenatal vitamin that contains at least 600 micrograms (mcg) of folic acid.  If you develop constipation, try taking a stool softener if your health care provider approves. Eating and drinking  Eat a balanced diet that includes fresh fruits and vegetables, whole grains, good sources of protein  such as meat, eggs, or tofu, and low-fat dairy. Your health care provider will help you determine the amount of weight gain that is right for you.  Avoid raw meat and uncooked cheese. These carry germs that can cause birth defects in the baby.  If you have low calcium intake from food, talk to your health care provider about whether you should take a daily calcium supplement.  Eat four or five small meals rather than three large meals a day.  Limit foods that are high in fat and processed sugars, such as fried and sweet foods.  To prevent constipation: ? Drink enough fluid to keep your urine clear or pale yellow. ? Eat foods that are high in fiber, such as fresh fruits and vegetables, whole grains, and beans. Activity  Exercise only as directed by your health care provider. Most women can continue their usual exercise routine during pregnancy. Try to exercise for 30 minutes at least 5 days a week. Stop exercising if you experience uterine contractions.  Avoid heavy   lifting.  Do not exercise in extreme heat or humidity, or at high altitudes.  Wear low-heel, comfortable shoes.  Practice good posture.  You may continue to have sex unless your health care provider tells you otherwise. Relieving pain and discomfort  Take frequent breaks and rest with your legs elevated if you have leg cramps or low back pain.  Take warm sitz baths to soothe any pain or discomfort caused by hemorrhoids. Use hemorrhoid cream if your health care provider approves.  Wear a good support bra to prevent discomfort from breast tenderness.  If you develop varicose veins: ? Wear support pantyhose or compression stockings as told by your healthcare provider. ? Elevate your feet for 15 minutes, 3-4 times a day. Prenatal care  Write down your questions. Take them to your prenatal visits.  Keep all your prenatal visits as told by your health care provider. This is important. Safety  Wear your seat belt at  all times when driving.  Make a list of emergency phone numbers, including numbers for family, friends, the hospital, and police and fire departments. General instructions  Avoid cat litter boxes and soil used by cats. These carry germs that can cause birth defects in the baby. If you have a cat, ask someone to clean the litter box for you.  Do not travel far distances unless it is absolutely necessary and only with the approval of your health care provider.  Do not use hot tubs, steam rooms, or saunas.  Do not drink alcohol.  Do not use any products that contain nicotine or tobacco, such as cigarettes and e-cigarettes. If you need help quitting, ask your health care provider.  Do not use any medicinal herbs or unprescribed drugs. These chemicals affect the formation and growth of the baby.  Do not douche or use tampons or scented sanitary pads.  Do not cross your legs for long periods of time.  To prepare for the arrival of your baby: ? Take prenatal classes to understand, practice, and ask questions about labor and delivery. ? Make a trial run to the hospital. ? Visit the hospital and tour the maternity area. ? Arrange for maternity or paternity leave through employers. ? Arrange for family and friends to take care of pets while you are in the hospital. ? Purchase a rear-facing car seat and make sure you know how to install it in your car. ? Pack your hospital bag. ? Prepare the baby's nursery. Make sure to remove all pillows and stuffed animals from the baby's crib to prevent suffocation.  Visit your dentist if you have not gone during your pregnancy. Use a soft toothbrush to brush your teeth and be gentle when you floss. Contact a health care provider if:  You are unsure if you are in labor or if your water has broken.  You become dizzy.  You have mild pelvic cramps, pelvic pressure, or nagging pain in your abdominal area.  You have lower back pain.  You have persistent  nausea, vomiting, or diarrhea.  You have an unusual or bad smelling vaginal discharge.  You have pain when you urinate. Get help right away if:  Your water breaks before 37 weeks.  You have regular contractions less than 5 minutes apart before 37 weeks.  You have a fever.  You are leaking fluid from your vagina.  You have spotting or bleeding from your vagina.  You have severe abdominal pain or cramping.  You have rapid weight loss or weight gain.    You have shortness of breath with chest pain.  You notice sudden or extreme swelling of your face, hands, ankles, feet, or legs.  Your baby makes fewer than 10 movements in 2 hours.  You have severe headaches that do not go away when you take medicine.  You have vision changes. Summary  The third trimester is from week 28 through week 40, months 7 through 9. The third trimester is a time when the unborn baby (fetus) is growing rapidly.  During the third trimester, your discomfort may increase as you and your baby continue to gain weight. You may have abdominal, leg, and back pain, sleeping problems, and an increased need to urinate.  During the third trimester your breasts will keep growing and they will continue to become tender. A yellow fluid (colostrum) may leak from your breasts. This is the first milk you are producing for your baby.  False labor is a condition in which you feel small, irregular tightenings of the muscles in the womb (contractions) that eventually go away. These are called Braxton Hicks contractions. Contractions may last for hours, days, or even weeks before true labor sets in.  Signs of labor can include: abdominal cramps; regular contractions that start at 10 minutes apart and become stronger and more frequent with time; watery or bloody mucus discharge that comes from the vagina; increased pelvic pressure and dull back pain; and leaking of amniotic fluid. This information is not intended to replace advice  given to you by your health care provider. Make sure you discuss any questions you have with your health care provider. Document Released: 12/20/2000 Document Revised: 06/03/2015 Document Reviewed: 02/27/2012 Elsevier Interactive Patient Education  2017 [employer]  

## 2017-09-11 NOTE — Progress Notes (Signed)
Pt has a fear of needles pt B/P is elevated after blood draw.  Pt states she will Tdap in 2 weeks at [facility]  Pt states her B/P got low last week and pt passed out was seen at [facility] in [place]  Pt states she has been fine since.  C/o : right wrist being numb sometimes.

## 2017-09-12 LAB — CBC
Hematocrit: 36.1 % (ref 34.0–46.6)
Hemoglobin: 12.5 g/dL (ref 11.1–15.9)
MCH: 32 pg (ref 26.6–33.0)
MCHC: 34.6 g/dL (ref 31.5–35.7)
MCV: 92 fL (ref 79–97)
PLATELETS: 295 10*3/uL (ref 150–450)
RBC: 3.91 x10E6/uL (ref 3.77–5.28)
RDW: 12 % — AB (ref 12.3–15.4)
WBC: 17.3 10*3/uL — ABNORMAL HIGH (ref 3.4–10.8)

## 2017-09-12 LAB — GLUCOSE TOLERANCE, 2 HOURS W/ 1HR
GLUCOSE, 1 HOUR: 236 mg/dL — AB (ref 65–179)
GLUCOSE, 2 HOUR: 167 mg/dL — AB (ref 65–152)
GLUCOSE, FASTING: 117 mg/dL — AB (ref 65–91)

## 2017-09-12 LAB — RPR: RPR Ser Ql: NONREACTIVE

## 2017-09-12 LAB — HIV ANTIBODY (ROUTINE TESTING W REFLEX): HIV Screen 4th Generation wRfx: NONREACTIVE

## 2017-09-13 ENCOUNTER — Other Ambulatory Visit: Payer: Self-pay | Admitting: Obstetrics & Gynecology

## 2017-09-13 ENCOUNTER — Telehealth: Payer: Self-pay

## 2017-09-13 ENCOUNTER — Other Ambulatory Visit: Payer: Self-pay | Admitting: *Deleted

## 2017-09-13 DIAGNOSIS — Z3400 Encounter for supervision of normal first pregnancy, unspecified trimester: Secondary | ICD-10-CM

## 2017-09-13 DIAGNOSIS — O24419 Gestational diabetes mellitus in pregnancy, unspecified control: Secondary | ICD-10-CM

## 2017-09-13 MED ORDER — GLUCOSE BLOOD VI STRP: ORAL_STRIP | 5 refills | Status: DC

## 2017-09-13 MED ORDER — ACCU-CHEK GUIDE ME W/DEVICE KIT: 1.0000 | PACK | Freq: Once | 0 refills | Status: AC

## 2017-09-13 MED ORDER — ACCU-CHEK FASTCLIX LANCETS MISC: 1.0000 | Freq: Four times a day (QID) | 5 refills | Status: DC

## 2017-09-13 NOTE — Telephone Encounter (Signed)
Returned call, advised of results 

## 2017-09-13 NOTE — Progress Notes (Signed)
Orders entered for GDM supplies and Nutrition consult. Pt made aware via mychart.

## 2017-09-26 ENCOUNTER — Encounter: Payer: Medicaid Other | Attending: Obstetrics and Gynecology | Admitting: Registered"

## 2017-09-26 ENCOUNTER — Encounter: Payer: Self-pay | Admitting: Obstetrics and Gynecology

## 2017-09-26 ENCOUNTER — Ambulatory Visit (INDEPENDENT_AMBULATORY_CARE_PROVIDER_SITE_OTHER): Payer: Medicaid Other | Admitting: Obstetrics and Gynecology

## 2017-09-26 VITALS — BP 127/82 | HR 98 | Wt 144.9 lb

## 2017-09-26 DIAGNOSIS — Z23 Encounter for immunization: Secondary | ICD-10-CM

## 2017-09-26 DIAGNOSIS — O99810 Abnormal glucose complicating pregnancy: Secondary | ICD-10-CM | POA: Insufficient documentation

## 2017-09-26 DIAGNOSIS — O24410 Gestational diabetes mellitus in pregnancy, diet controlled: Secondary | ICD-10-CM

## 2017-09-26 DIAGNOSIS — Z713 Dietary counseling and surveillance: Secondary | ICD-10-CM | POA: Diagnosis present

## 2017-09-26 DIAGNOSIS — Z3400 Encounter for supervision of normal first pregnancy, unspecified trimester: Secondary | ICD-10-CM

## 2017-09-26 NOTE — Progress Notes (Signed)
Patient reports good fetal movement with some pressure. TDAP given today

## 2017-09-26 NOTE — Patient Instructions (Signed)

## 2017-09-26 NOTE — Progress Notes (Signed)
Subjective:  Cassandra Marsh is a 32 y.o. G1P0 at 468w0d being seen today for ongoing prenatal care.  She is currently monitored for the following issues for this high-risk pregnancy and has Supervision of normal first pregnancy, antepartum and Tobacco smoking affecting pregnancy, antepartum on their problem list.  Patient reports no complaints.  Contractions: Not present. Vag. Bleeding: None.  Movement: Present. Denies leaking of fluid.   The following portions of the patient's history were reviewed and updated as appropriate: allergies, current medications, past family history, past medical history, past social history, past surgical history and problem list. Problem list updated.  Objective:   Vitals:   09/26/17 1348  BP: 127/82  Pulse: 98  Weight: 144 lb 14.4 oz (65.7 kg)    Fetal Status: Fetal Heart Rate (bpm): 145   Movement: Present     General:  Alert, oriented and cooperative. Patient is in no acute distress.  Skin: Skin is warm and dry. No rash noted.   Cardiovascular: Normal heart rate noted  Respiratory: Normal respiratory effort, no problems with respiration noted  Abdomen: Soft, gravid, appropriate for gestational age. Pain/Pressure: Present     Pelvic:  Cervical exam deferred        Extremities: Normal range of motion.  Edema: Trace  Mental Status: Normal mood and affect. Normal behavior. Normal judgment and thought content.   Urinalysis:      Assessment and Plan:  Pregnancy: G1P0 at 358w0d  1. Supervision of normal first pregnancy, antepartum Stable - Tdap vaccine greater than or equal to 7yo IM  2. Diet controlled gestational diabetes mellitus (GDM) in third trimester GDM and pregnancy reviewed with pt To see diabetic educator today Glycemic control to reduce risks of GDM reviewed Pt instructed to bring CBG's to all appt.   Preterm labor symptoms and general obstetric precautions including but not limited to vaginal bleeding, contractions, leaking of fluid and  fetal movement were reviewed in detail with the patient. Please refer to After Visit Summary for other counseling recommendations.  Return in about 2 weeks (around 10/10/2017) for OB visit.   Hermina StaggersErvin, Dimitry Holsworth L, MD

## 2017-09-28 ENCOUNTER — Encounter: Payer: Self-pay | Admitting: Registered"

## 2017-09-28 DIAGNOSIS — O99810 Abnormal glucose complicating pregnancy: Secondary | ICD-10-CM

## 2017-09-28 HISTORY — DX: Abnormal glucose complicating pregnancy: O99.810

## 2017-09-28 NOTE — Progress Notes (Signed)
Patient was seen on 09/26/2017 for Gestational Diabetes self-management class at [facility]. The following learning objectives were met by the patient during this course:   States the definition of Gestational Diabetes  States why dietary management is important in controlling blood glucose  Describes the effects each nutrient has on blood glucose levels  Demonstrates ability to create a balanced meal plan  Demonstrates carbohydrate counting   States when to check blood glucose levels  Demonstrates proper blood glucose monitoring techniques  States the effect of stress and exercise on blood glucose levels  States the importance of limiting caffeine and abstaining from alcohol and smoking  Blood glucose monitor given: Accu-chek Guide Lot # [id number] Exp: 05/19/18 Blood glucose reading: 87  Patient instructed to monitor glucose levels: FBS: 60 - <95; 1 hour: <140; 2 hour: <120  Patient received handouts:  Nutrition Diabetes and Pregnancy, including carb counting list  Patient will be seen for follow-up as needed.

## 2017-10-04 ENCOUNTER — Other Ambulatory Visit: Payer: Self-pay

## 2017-10-04 MED ORDER — VITAFOL ULTRA 29-0.6-0.4-200 MG PO CAPS: 1.0000 | ORAL_CAPSULE | Freq: Every day | ORAL | 12 refills | Status: DC

## 2017-10-10 ENCOUNTER — Encounter: Payer: Medicaid Other | Admitting: Obstetrics & Gynecology

## 2017-10-11 ENCOUNTER — Ambulatory Visit (INDEPENDENT_AMBULATORY_CARE_PROVIDER_SITE_OTHER): Payer: Medicaid Other | Admitting: Certified Nurse Midwife

## 2017-10-11 ENCOUNTER — Encounter: Payer: Self-pay | Admitting: Certified Nurse Midwife

## 2017-10-11 VITALS — BP 123/78 | HR 96 | Wt 142.0 lb

## 2017-10-11 DIAGNOSIS — O24919 Unspecified diabetes mellitus in pregnancy, unspecified trimester: Secondary | ICD-10-CM | POA: Insufficient documentation

## 2017-10-11 DIAGNOSIS — Z3400 Encounter for supervision of normal first pregnancy, unspecified trimester: Secondary | ICD-10-CM

## 2017-10-11 DIAGNOSIS — O24410 Gestational diabetes mellitus in pregnancy, diet controlled: Secondary | ICD-10-CM

## 2017-10-11 NOTE — Patient Instructions (Signed)
Insulin Treatment for Diabetes °Diabetes (diabetes mellitus) is a long-term (chronic) disease. It occurs when the body does not properly use sugar (glucose) that is released from food after digestion. Glucose levels are controlled by a hormone called insulin, which is made in the pancreas. °· If you have type 1 diabetes, the pancreas does not make any insulin, so you must take insulin. °· If you have type 2 diabetes, you might need to take insulin along with other medicines. In type 2 diabetes, one or both of these problems may be present: °? The pancreas does not make enough insulin. °? Cells in the body do not respond properly to insulin that the body makes (insulin resistance). ° °You must use insulin correctly to control your diabetes. You must have some insulin in your body at all times. Insulin treatment varies depending on your type of diabetes, your treatment goals, and your medical history. It is important for you to understand your insulin treatment plan so you can be an active partner in managing your diabetes. °How is insulin given? °Insulin can only be given through a shot (injection). It is injected using a syringe and needle, an insulin pen, a pump, or a jet injector. Your health care provider will: °· Prescribe the amount and type of insulin that you need. °· Tell you when you should inject your insulin. ° °Where on the body should insulin be injected? °Insulin is injected into a layer of fatty tissue under the skin. Good places to inject insulin include: °· Abdomen. Generally, the abdomen is the best place to inject insulin. However, you should avoid any area that is less than 2 inches (5 cm) from the belly button (navel). °· Front and outer area of the upper thighs. °· The back of the upper arms. °· Upper buttocks. ° °It is important to: °· Give your injection in a slightly different place each time. This helps to prevent irritation and improve absorption. °· Avoid injecting into areas that have  scar tissue. ° °Usually, you will give yourself insulin injections. Others can also be taught how to give you injections. You will use a special type of syringe that is made only for insulin. Some people may have an insulin pump that delivers insulin steadily through a tube (cannula) that is placed under the skin. °What are the different types of insulin? °The following information is a general guide to different types of insulin. Specifics vary depending on the insulin product that your health care provider prescribes. °· Rapid-acting insulin: °? Starts working quickly, in as little as 5 minutes. °? Can last for 4-6 hours, or sometimes longer. °? Works well when taken right before a meal to quickly lower blood glucose. °· Short-acting insulin: °? Starts working in about 30 minutes. °? Can last for 6-10 hours. °? Should be taken about 30 minutes before you start eating a meal. °· Intermediate-acting insulin: °? Starts working in 1-2 hours. °? Lasts for about 10-18 hours. °? Lowers your blood glucose for a longer period of time but is not as effective for lowering blood glucose right after a meal. °· Long-acting insulin: °? Mimics the small amount of insulin that your pancreas usually produces throughout the day. °? Should be used either one or two times a day. °? Is usually used in combination with other types of insulin or other medicines. °· Concentrated insulin, or U-500 insulin: °? Contains a higher dose of insulin than most rapid-acting insulins. U-500 insulin has 5 times the amount   of insulin per 1 mL. °? Should only be used with the special U-500 syringe or U-500 insulin pen. It is dangerous to use the wrong type of syringe with this insulin. ° °What are the side effects of insulin? °Possible side effects of insulin treatment include: °· Low blood glucose (hypoglycemia). °· Weight gain. °· High blood glucose (hyperglycemia). °· Skin injury or irritation. ° °Some of these side effects can be caused by using  improper injection technique. It is important to learn to inject insulin properly. °What are common terms associated with insulin treatment? °Some terms that you might hear include: °· Basal insulin, or basal rate. This is the constant amount of insulin that needs to be present in your body to stabilize your blood glucose levels. People who have type 1 diabetes need basal insulin in a steady (continuous) dose 24 hours a day. °? Usually, intermediate-acting or long-acting insulin is used one or two times a day to manage basal insulin levels. °? Medicines that are taken by mouth may also be recommended to manage basal insulin levels. °· Prandial insulin. This refers to meal-related insulin. °? Blood glucose rises quickly after a meal (postprandial). Rapid-acting or short-acting insulin can be used right before a meal (preprandial) to quickly lower blood glucose. °? You may be instructed to adjust the amount of prandial insulin that you take depending on how much carbohydrate (starch) is in your meal. °· Corrective insulin. This may also be called a correction dose or supplemental dose. This is a small amount of rapid-acting or short-acting insulin that can be used to lower blood glucose if it is too high. You may be instructed to check your blood glucose at certain times of the day and use corrective insulin as needed. °· Tight control, or intensive therapy. This means keeping your blood glucose as close to your target as possible, and preventing it from getting too high after meals. People who have tight control of their diabetes have fewer long-term problems caused by diabetes. ° °General instructions ° °Talk with your health care provider or pharmacist about the type of insulin you should take and when you should take it. You should know when your insulin peaks and when it wears off. You need this information so you can plan your meals and exercise. You also need to work with your health care provider to: °· Check  your blood glucose every day. Your health care provider will tell you how often and when you should do this. °· Manage your: °? Weight. °? Blood pressure. °? Cholesterol. °? Stress. °· Eat a healthy diet. °· Exercise regularly. ° °This information is not intended to replace advice given to you by your health care provider. Make sure you discuss any questions you have with your health care provider. °Document Released: 03/24/2008 Document Revised: 06/03/2015 Document Reviewed: 01/29/2015 °Elsevier Interactive Patient Education © 2018 [employer] ° °

## 2017-10-11 NOTE — Progress Notes (Signed)
Patient reports good fetal movement with some uterine irritability. 

## 2017-10-11 NOTE — Progress Notes (Signed)
   PRENATAL VISIT NOTE  Subjective:  Cassandra Marsh is a 32 y.o. G1P0 at [redacted]w[redacted]d being seen today for ongoing prenatal care.  She is currently monitored for the following issues for this high-risk pregnancy and has Supervision of normal first pregnancy, antepartum; Tobacco smoking affecting pregnancy, antepartum; Abnormal glucose tolerance test (GTT) during pregnancy, antepartum; and Diabetes mellitus during pregnancy, antepartum on their problem list.  Patient reports UI.  Contractions: Irritability. Vag. Bleeding: None.  Movement: Present. Denies leaking of fluid.   The following portions of the patient's history were reviewed and updated as appropriate: allergies, current medications, past family history, past medical history, past social history, past surgical history and problem list. Problem list updated.  Objective:   Vitals:   10/11/17 1424  BP: 123/78  Pulse: 96  Weight: 142 lb (64.4 kg)    Fetal Status: Fetal Heart Rate (bpm): NST Fundal Height: 31 cm Movement: Present     General:  Alert, oriented and cooperative. Patient is in no acute distress.  Skin: Skin is warm and dry. No rash noted.   Cardiovascular: Normal heart rate noted  Respiratory: Normal respiratory effort, no problems with respiration noted  Abdomen: Soft, gravid, appropriate for gestational age.  Pain/Pressure: Present     Pelvic: Cervical exam deferred        Extremities: Normal range of motion.  Edema: Trace  Mental Status: Normal mood and affect. Normal behavior. Normal judgment and thought content.   Assessment and Plan:  Pregnancy: G1P0 at [redacted]w[redacted]d  1. Supervision of normal first pregnancy, antepartum - Patient doing well, no complaints other than uterine irritability occasionally during the night  - Anticipatory guidance on upcoming appointments  - Detailed discussion and education on GDM   2. Diet controlled gestational diabetes mellitus (GDM), antepartum - NST reactive in office today, baseline  135/ moderate variability/ +accels/ no deceleration. No contractions or UI noted on monitor.  - Glucose log reviewed and noted 95% of glucose elevated over 2 weeks  - Fasting ranging from 103-150, with one normal at 87 - Postprandial ranging from 79-192  - Discussed with Dr Earlene Plater initiation of insulin for GDM, recommends patient be referred to diabetes counseling for initiation of insulin- no longer diet controled GDM  - Initiation of antenatal testing  - [place] MFM FETAL BPP WO NON STRESS; Future - Fetal nonstress test; Future - Referral to Nutrition and Diabetes Services - [place] MFM FETAL BPP W/NONSTRESS; Future - [place] MFM OB FOLLOW UP; Future  Preterm labor symptoms and general obstetric precautions including but not limited to vaginal bleeding, contractions, leaking of fluid and fetal movement were reviewed in detail with the patient. Please refer to After Visit Summary for other counseling recommendations.  Return in about 2 weeks (around 10/25/2017) for HROB.  Future Appointments  Date Time Provider Department Center  10/16/2017 11:00 AM WOC-EDUCATION [facility] WOC  10/17/2017  9:00 AM WH-MFC [place] 5 WH-MFCUS MFC-US  10/25/2017  2:30 PM Conan Bowens, MD [facility] None    Sharyon Cable, CNM

## 2017-10-16 ENCOUNTER — Encounter: Payer: Medicaid Other | Attending: Obstetrics and Gynecology | Admitting: *Deleted

## 2017-10-16 ENCOUNTER — Ambulatory Visit: Payer: Medicaid Other | Admitting: *Deleted

## 2017-10-16 DIAGNOSIS — Z713 Dietary counseling and surveillance: Secondary | ICD-10-CM | POA: Diagnosis present

## 2017-10-16 DIAGNOSIS — O99810 Abnormal glucose complicating pregnancy: Secondary | ICD-10-CM | POA: Insufficient documentation

## 2017-10-16 DIAGNOSIS — O24419 Gestational diabetes mellitus in pregnancy, unspecified control: Secondary | ICD-10-CM

## 2017-10-16 NOTE — Progress Notes (Signed)
Insulin Instruction  Patient was seen on 10/16/2017 for insulin instruction.  The following learning objectives were met by the patient during this visit:   Insulin Action of analog, Regular and NPH insulins  Reviewed syringe & vial VS pen including # units per syringe,    length of needles,   Hygiene and storage  Drawing up single and mixed doses if using vials   Single dose   Mixed dose:   Rotation of Sites  Hypoglycemia- symptoms, causes , treatment choices  Record keeping and MD follow up   Patient demonstrated understanding of insulin administration by return demonstration.  Patient received the following handouts:  Insulin Instruction Handout  I recommend the Rx for 1.0 cc syringes so if mixing a dose that totals over 50 units, it will be adequate Orders for type and dose of insulin not available at this time, I called [place] and left message on Cassandra Marsh's VM notifying her the need for orders before insulin can be started.                                         Patient to start on insulin when Rx'd by provider  Patient will be seen for follow-up as needed.

## 2017-10-17 ENCOUNTER — Encounter (HOSPITAL_COMMUNITY): Payer: Self-pay

## 2017-10-17 ENCOUNTER — Other Ambulatory Visit (HOSPITAL_COMMUNITY): Payer: Self-pay | Admitting: *Deleted

## 2017-10-17 ENCOUNTER — Ambulatory Visit (HOSPITAL_COMMUNITY)
Admission: RE | Admit: 2017-10-17 | Discharge: 2017-10-17 | Disposition: A | Discharge status: Home / Self Care | Payer: Medicaid Other | Source: Ambulatory Visit | Attending: Certified Nurse Midwife | Admitting: Certified Nurse Midwife

## 2017-10-17 DIAGNOSIS — Z3A33 33 weeks gestation of pregnancy: Secondary | ICD-10-CM | POA: Diagnosis not present

## 2017-10-17 DIAGNOSIS — O24410 Gestational diabetes mellitus in pregnancy, diet controlled: Secondary | ICD-10-CM

## 2017-10-17 DIAGNOSIS — O99333 Smoking (tobacco) complicating pregnancy, third trimester: Secondary | ICD-10-CM | POA: Insufficient documentation

## 2017-10-17 DIAGNOSIS — O99332 Smoking (tobacco) complicating pregnancy, second trimester: Secondary | ICD-10-CM | POA: Diagnosis not present

## 2017-10-17 DIAGNOSIS — Z362 Encounter for other antenatal screening follow-up: Secondary | ICD-10-CM | POA: Diagnosis not present

## 2017-10-17 DIAGNOSIS — O24414 Gestational diabetes mellitus in pregnancy, insulin controlled: Secondary | ICD-10-CM | POA: Diagnosis not present

## 2017-10-18 ENCOUNTER — Encounter: Payer: Self-pay | Admitting: Certified Nurse Midwife

## 2017-10-18 ENCOUNTER — Other Ambulatory Visit: Payer: Self-pay | Admitting: Certified Nurse Midwife

## 2017-10-18 DIAGNOSIS — O24414 Gestational diabetes mellitus in pregnancy, insulin controlled: Secondary | ICD-10-CM | POA: Insufficient documentation

## 2017-10-18 MED ORDER — INSULIN LISPRO 100 UNIT/ML ~~LOC~~ SOLN: SUBCUTANEOUS | 1 refills | Status: DC

## 2017-10-18 MED ORDER — INSULIN NPH (HUMAN) (ISOPHANE) 100 UNIT/ML ~~LOC~~ SUSP: SUBCUTANEOUS | 1 refills | Status: DC

## 2017-10-18 NOTE — Progress Notes (Signed)
Patient was initiated on insulin for control of GDM. Completed diabetes and insulin teaching on 10/8. Rx sent to pharmacy of choice.  Humalog 10 units and NPH 18 units before breakfast  Humalog 8 units before dinner  NPH 8 units at bedtime    Sharyon Cable, CNM 10/18/17, 11:41 AM

## 2017-10-24 ENCOUNTER — Encounter (HOSPITAL_COMMUNITY): Payer: Self-pay

## 2017-10-24 ENCOUNTER — Ambulatory Visit (HOSPITAL_COMMUNITY)
Admission: RE | Admit: 2017-10-24 | Discharge: 2017-10-24 | Disposition: A | Discharge status: Home / Self Care | Payer: Medicaid Other | Source: Ambulatory Visit | Attending: Certified Nurse Midwife | Admitting: Certified Nurse Midwife

## 2017-10-24 DIAGNOSIS — Z3A34 34 weeks gestation of pregnancy: Secondary | ICD-10-CM | POA: Diagnosis not present

## 2017-10-24 DIAGNOSIS — O24414 Gestational diabetes mellitus in pregnancy, insulin controlled: Secondary | ICD-10-CM | POA: Diagnosis present

## 2017-10-24 DIAGNOSIS — Z362 Encounter for other antenatal screening follow-up: Secondary | ICD-10-CM

## 2017-10-24 DIAGNOSIS — O99332 Smoking (tobacco) complicating pregnancy, second trimester: Secondary | ICD-10-CM | POA: Diagnosis not present

## 2017-10-25 ENCOUNTER — Ambulatory Visit (INDEPENDENT_AMBULATORY_CARE_PROVIDER_SITE_OTHER): Payer: Medicaid Other | Admitting: Obstetrics and Gynecology

## 2017-10-25 ENCOUNTER — Encounter: Payer: Self-pay | Admitting: Obstetrics and Gynecology

## 2017-10-25 VITALS — BP 123/81 | HR 96 | Wt 152.2 lb

## 2017-10-25 DIAGNOSIS — O99330 Smoking (tobacco) complicating pregnancy, unspecified trimester: Secondary | ICD-10-CM

## 2017-10-25 DIAGNOSIS — Z3400 Encounter for supervision of normal first pregnancy, unspecified trimester: Secondary | ICD-10-CM

## 2017-10-25 DIAGNOSIS — O24414 Gestational diabetes mellitus in pregnancy, insulin controlled: Secondary | ICD-10-CM

## 2017-10-25 DIAGNOSIS — O99333 Smoking (tobacco) complicating pregnancy, third trimester: Secondary | ICD-10-CM

## 2017-10-25 MED ORDER — INSULIN NPH (HUMAN) (ISOPHANE) 100 UNIT/ML ~~LOC~~ SUSP: SUBCUTANEOUS | 1 refills | Status: DC

## 2017-10-25 NOTE — Progress Notes (Signed)
   PRENATAL VISIT NOTE  Subjective:  Cassandra Marsh is a 32 y.o. G1P0 at [redacted]w[redacted]d being seen today for ongoing prenatal care.  She is currently monitored for the following issues for this high-risk pregnancy and has Supervision of normal first pregnancy, antepartum; Tobacco smoking affecting pregnancy, antepartum; Abnormal glucose tolerance test (GTT) during pregnancy, antepartum; Diabetes mellitus during pregnancy, antepartum; and Insulin controlled gestational diabetes mellitus (GDM) in third trimester on their problem list.  Patient reports no complaints.  Contractions: Not present. Vag. Bleeding: None.  Movement: Present. Denies leaking of fluid.   The following portions of the patient's history were reviewed and updated as appropriate: allergies, current medications, past family history, past medical history, past social history, past surgical history and problem list. Problem list updated.  Objective:   Vitals:   10/25/17 1437  BP: 123/81  Pulse: 96  Weight: 152 lb 3.2 oz (69 kg)    Fetal Status: Fetal Heart Rate (bpm): NST   Movement: Present     General:  Alert, oriented and cooperative. Patient is in no acute distress.  Skin: Skin is warm and dry. No rash noted.   Cardiovascular: Normal heart rate noted  Respiratory: Normal respiratory effort, no problems with respiration noted  Abdomen: Soft, gravid, appropriate for gestational age.  Pain/Pressure: Absent     Pelvic: Cervical exam deferred        Extremities: Normal range of motion.  Edema: Trace  Mental Status: Normal mood and affect. Normal behavior. Normal judgment and thought content.   Assessment and Plan:  Pregnancy: G1P0 at [redacted]w[redacted]d  1. Supervision of normal first pregnancy, antepartum  2. Insulin controlled gestational diabetes mellitus (GDM) in third trimester Patient started insulin on Monday, only taking Humalog for now, 10 units in am, 8 units at night Since starting insulin, FG 109-140 (much improved from  160-200), PP 92 - 188, mostly > 120) Has not started NPH as it was not at pharmacy, NPH resent to pharmacy, will start 10 units in am, 8 units at night Cont weekly NST/BPP BPP yesterday 8/8 NST today reactive Last growth 69th%tile  3. Tobacco smoking affecting pregnancy, antepartum   Preterm labor symptoms and general obstetric precautions including but not limited to vaginal bleeding, contractions, leaking of fluid and fetal movement were reviewed in detail with the patient. Please refer to After Visit Summary for other counseling recommendations.  Return in about 1 week (around 11/01/2017) for OB visit (MD).  Future Appointments  Date Time Provider Department Center  10/30/2017  2:45 PM Constant, Gigi Gin, MD [facility] None  10/31/2017  2:45 PM WH-MFC [place] 2 WH-MFCUS MFC-US  11/07/2017  2:45 PM WH-MFC [place] 2 WH-MFCUS MFC-US  11/14/2017  2:30 PM WH-MFC [place] 1 WH-MFCUS MFC-US    Conan Bowens, MD

## 2017-10-25 NOTE — Progress Notes (Signed)
Patient reports good fetal movement, denies pain. Pt stated that she started insulin on 10-22-17

## 2017-10-30 ENCOUNTER — Ambulatory Visit (INDEPENDENT_AMBULATORY_CARE_PROVIDER_SITE_OTHER): Payer: Medicaid Other | Admitting: Obstetrics and Gynecology

## 2017-10-30 ENCOUNTER — Encounter: Payer: Self-pay | Admitting: Obstetrics and Gynecology

## 2017-10-30 VITALS — BP 138/80 | HR 89 | Wt 151.7 lb

## 2017-10-30 DIAGNOSIS — O24414 Gestational diabetes mellitus in pregnancy, insulin controlled: Secondary | ICD-10-CM

## 2017-10-30 NOTE — Progress Notes (Signed)
   PRENATAL VISIT NOTE  Subjective:  Cassandra Marsh is a 32 y.o. G1P0 at [redacted]w[redacted]d being seen today for ongoing prenatal care.  She is currently monitored for the following issues for this high-risk pregnancy and has Supervision of normal first pregnancy, antepartum; Tobacco smoking affecting pregnancy, antepartum; Abnormal glucose tolerance test (GTT) during pregnancy, antepartum; Diabetes mellitus during pregnancy, antepartum; and Insulin controlled gestational diabetes mellitus (GDM) in third trimester on their problem list.  Patient reports backache.  Contractions: Not present. Vag. Bleeding: None.  Movement: Present. Denies leaking of fluid.   The following portions of the patient's history were reviewed and updated as appropriate: allergies, current medications, past family history, past medical history, past social history, past surgical history and problem list. Problem list updated.  Objective:   Vitals:   10/30/17 1453  BP: 138/80  Pulse: 89  Weight: 151 lb 11.2 oz (68.8 kg)    Fetal Status: Fetal Heart Rate (bpm): nst Fundal Height: 33 cm Movement: Present     General:  Alert, oriented and cooperative. Patient is in no acute distress.  Skin: Skin is warm and dry. No rash noted.   Cardiovascular: Normal heart rate noted  Respiratory: Normal respiratory effort, no problems with respiration noted  Abdomen: Soft, gravid, appropriate for gestational age.  Pain/Pressure: Absent     Pelvic: Cervical exam deferred        Extremities: Normal range of motion.  Edema: Trace  Mental Status: Normal mood and affect. Normal behavior. Normal judgment and thought content.   Assessment and Plan:  Pregnancy: G1P0 at [redacted]w[redacted]d  1. Insulin controlled gestational diabetes mellitus (GDM) in third trimester Patient started NPH the evening of 10/20 CBGs reviewed and all within range with the exception of this am fasting 123 Continue insulin regimen at current dosage Continue antenatal testing  NST  reviewed and reactive with baseline 140, mod variability, +accels, no decels  Preterm labor symptoms and general obstetric precautions including but not limited to vaginal bleeding, contractions, leaking of fluid and fetal movement were reviewed in detail with the patient. Please refer to After Visit Summary for other counseling recommendations.  No follow-ups on file.  Future Appointments  Date Time Provider Department Center  10/31/2017  2:45 PM WH-MFC [place] 2 WH-MFCUS MFC-US  11/07/2017  2:45 PM WH-MFC [place] 2 WH-MFCUS MFC-US  11/14/2017  2:30 PM WH-MFC [place] 1 WH-MFCUS MFC-US    Catalina Antigua, MD

## 2017-10-31 ENCOUNTER — Ambulatory Visit (HOSPITAL_COMMUNITY): Admission: RE | Admit: 2017-10-31 | Payer: Medicaid Other | Source: Ambulatory Visit

## 2017-11-01 ENCOUNTER — Ambulatory Visit (HOSPITAL_COMMUNITY)
Admission: RE | Admit: 2017-11-01 | Discharge: 2017-11-01 | Disposition: A | Discharge status: Home / Self Care | Payer: Medicaid Other | Source: Ambulatory Visit | Attending: Certified Nurse Midwife | Admitting: Certified Nurse Midwife

## 2017-11-01 ENCOUNTER — Encounter (HOSPITAL_COMMUNITY): Payer: Self-pay

## 2017-11-01 DIAGNOSIS — Z362 Encounter for other antenatal screening follow-up: Secondary | ICD-10-CM

## 2017-11-01 DIAGNOSIS — O24414 Gestational diabetes mellitus in pregnancy, insulin controlled: Secondary | ICD-10-CM | POA: Diagnosis not present

## 2017-11-01 DIAGNOSIS — Z3A35 35 weeks gestation of pregnancy: Secondary | ICD-10-CM | POA: Diagnosis not present

## 2017-11-01 DIAGNOSIS — O99332 Smoking (tobacco) complicating pregnancy, second trimester: Secondary | ICD-10-CM | POA: Diagnosis not present

## 2017-11-01 DIAGNOSIS — O99333 Smoking (tobacco) complicating pregnancy, third trimester: Secondary | ICD-10-CM | POA: Diagnosis not present

## 2017-11-06 ENCOUNTER — Other Ambulatory Visit: Payer: Self-pay

## 2017-11-06 MED ORDER — "INSULIN SYRINGE-NEEDLE U-100 25G X 1"" 1 ML MISC": 1.0000 | 2 refills | Status: DC | PRN

## 2017-11-07 ENCOUNTER — Encounter: Payer: Medicaid Other | Admitting: Certified Nurse Midwife

## 2017-11-07 ENCOUNTER — Other Ambulatory Visit (HOSPITAL_COMMUNITY)
Admission: RE | Admit: 2017-11-07 | Discharge: 2017-11-07 | Disposition: A | Discharge status: Home / Self Care | Payer: Medicaid Other | Source: Ambulatory Visit | Attending: Certified Nurse Midwife | Admitting: Certified Nurse Midwife

## 2017-11-07 ENCOUNTER — Ambulatory Visit (HOSPITAL_COMMUNITY)
Admission: RE | Admit: 2017-11-07 | Discharge: 2017-11-07 | Disposition: A | Discharge status: Home / Self Care | Payer: Medicaid Other | Source: Ambulatory Visit | Attending: Certified Nurse Midwife | Admitting: Certified Nurse Midwife

## 2017-11-07 ENCOUNTER — Other Ambulatory Visit (HOSPITAL_COMMUNITY): Payer: Self-pay | Admitting: Obstetrics and Gynecology

## 2017-11-07 ENCOUNTER — Ambulatory Visit (INDEPENDENT_AMBULATORY_CARE_PROVIDER_SITE_OTHER): Payer: Medicaid Other | Admitting: Certified Nurse Midwife

## 2017-11-07 ENCOUNTER — Encounter: Payer: Self-pay | Admitting: Certified Nurse Midwife

## 2017-11-07 ENCOUNTER — Encounter (HOSPITAL_COMMUNITY): Payer: Self-pay

## 2017-11-07 VITALS — BP 164/73 | HR 91 | Wt 156.0 lb

## 2017-11-07 DIAGNOSIS — O163 Unspecified maternal hypertension, third trimester: Secondary | ICD-10-CM

## 2017-11-07 DIAGNOSIS — Z3A36 36 weeks gestation of pregnancy: Secondary | ICD-10-CM | POA: Insufficient documentation

## 2017-11-07 DIAGNOSIS — O99332 Smoking (tobacco) complicating pregnancy, second trimester: Secondary | ICD-10-CM | POA: Diagnosis not present

## 2017-11-07 DIAGNOSIS — O24414 Gestational diabetes mellitus in pregnancy, insulin controlled: Secondary | ICD-10-CM | POA: Diagnosis present

## 2017-11-07 DIAGNOSIS — Z362 Encounter for other antenatal screening follow-up: Secondary | ICD-10-CM

## 2017-11-07 DIAGNOSIS — Z3400 Encounter for supervision of normal first pregnancy, unspecified trimester: Secondary | ICD-10-CM

## 2017-11-07 DIAGNOSIS — Z3403 Encounter for supervision of normal first pregnancy, third trimester: Secondary | ICD-10-CM | POA: Insufficient documentation

## 2017-11-07 LAB — OB RESULTS CONSOLE GC/CHLAMYDIA: Gonorrhea: NEGATIVE

## 2017-11-07 NOTE — Procedures (Signed)
Cassandra Marsh September 02, 1985 [redacted]w[redacted]d  Fetus A Non-Stress Test Interpretation for 11/07/17  Indication: Gestational Diabetes medication controlled  Fetal Heart Rate A Mode: External Baseline Rate (A): 150 bpm Variability: Moderate Accelerations: 15 x 15 Decelerations: None Multiple birth?: No  Uterine Activity Mode: Toco Contraction Frequency (min): OCC UC noted Contraction Duration (sec): 60-120 Contraction Quality: Mild Resting Tone Palpated: Relaxed Resting Time: Adequate  Interpretation (Fetal Testing) Nonstress Test Interpretation: Reactive Comments: FHR tracing rev'd by Dr. Judeth Cornfield

## 2017-11-07 NOTE — Progress Notes (Signed)
   PRENATAL VISIT NOTE  Subjective:  Cassandra Marsh is a 32 y.o. G1P0 at 12w0dbeing seen today for ongoing prenatal care.  She is currently monitored for the following issues for this high-risk pregnancy and has Supervision of normal first pregnancy, antepartum; Tobacco smoking affecting pregnancy, antepartum; Abnormal glucose tolerance test (GTT) during pregnancy, antepartum; Diabetes mellitus during pregnancy, antepartum; and Insulin controlled gestational diabetes mellitus (GDM) in third trimester on their problem list.  Patient reports no complaints.  Contractions: Irregular. Vag. Bleeding: None.  Movement: Present. Denies leaking of fluid.   The following portions of the patient's history were reviewed and updated as appropriate: allergies, current medications, past family history, past medical history, past social history, past surgical history and problem list. Problem list updated.  Objective:   Vitals:   11/07/17 1626  BP: (!) 164/73  Pulse: 91  Weight: 156 lb (70.8 kg)    Fetal Status: Fetal Heart Rate (bpm): 157 Fundal Height: 34 cm Movement: Present  Presentation: Vertex  General:  Alert, oriented and cooperative. Patient is in no acute distress.  Skin: Skin is warm and dry. No rash noted.   Cardiovascular: Normal heart rate noted  Respiratory: Normal respiratory effort, no problems with respiration noted  Abdomen: Soft, gravid, appropriate for gestational age.  Pain/Pressure: Absent     Pelvic: Cervical exam performed Dilation: Closed Effacement (%): Thick Station: -3  Extremities: Normal range of motion.  Edema: Trace  Mental Status: Normal mood and affect. Normal behavior. Normal judgment and thought content.   Assessment and Plan:  Pregnancy: G1P0 at 331w0d1. Supervision of normal first pregnancy, antepartum - Patient doing well, no complaints  - BPP completed at MFM prior to appointment in office  - Strep Gp B NAA - Cervicovaginal ancillary only( [facility])  2. Hypertension affecting pregnancy in third trimester - Patient reports BP was slightly elevated at MFM  - Called MFM and BP reported to be 141/80 and 142/79 - Patient denies HA, visual changes or RUQ abdominal pain  - Initial BP was 164/73 within 5 minutes of patient's arrival from MFM- BP most likely due to rushing/walking here from appointment, repeat 117/76 - Labs obtained to assess for PEC, BP check in the office tomorrow  - Patient has BP monitor at home and discussed reasons to return to [facility]  - Protein / creatinine ratio, urine - CBC - Comp Met (CMET)  3. Insulin controlled gestational diabetes mellitus (GDM) in third trimester - Patient did not bring log to appointment, states glucose levels have been in normal range over the past 2 weeks  - Encouraged to bring log to every appointment, patient verbalizes understanding.   labor symptoms and general obstetric precautions including but not limited to vaginal bleeding, contractions, leaking of fluid and fetal movement were reviewed in detail with the patient. Please refer to After Visit Summary for other counseling recommendations.  Return in about 1 day (around 11/08/2017).  Future Appointments  Date Time Provider [place]  2:15 PM [place] [place]  11/14/2017  2:30 PM WH-MFC [place] WH-MFCUS MFC-US    VeLajean ManesCNM

## 2017-11-07 NOTE — MAU Note (Signed)
Pt here today in [facility] for [facility].  Pt states she had some clear discharge yesterday which was concerning to her but none today.  Did not require her to change panties or wear a sanitary pad.  Reports good movement today but has noticed decreased gross movements in the last 3-4 days.  Pt states she has started to have some nonpitting feet/hand swelling since last night.  Denies H/A, vision changes, or epigastric pain.

## 2017-11-08 ENCOUNTER — Telehealth: Payer: Self-pay

## 2017-11-08 LAB — COMPREHENSIVE METABOLIC PANEL
ALT: 9 IU/L (ref 0–32)
AST: 9 IU/L (ref 0–40)
Albumin/Globulin Ratio: 1.3 (ref 1.2–2.2)
Albumin: 3.5 g/dL (ref 3.5–5.5)
Alkaline Phosphatase: 172 IU/L — ABNORMAL HIGH (ref 39–117)
BUN/Creatinine Ratio: 12 (ref 9–23)
BUN: 6 mg/dL (ref 6–20)
Bilirubin Total: 0.2 mg/dL (ref 0.0–1.2)
CO2: 19 mmol/L — ABNORMAL LOW (ref 20–29)
Calcium: 9.5 mg/dL (ref 8.7–10.2)
Chloride: 106 mmol/L (ref 96–106)
Creatinine, Ser: 0.51 mg/dL — ABNORMAL LOW (ref 0.57–1.00)
GFR calc Af Amer: 145 mL/min/{1.73_m2} (ref >59)
GFR calc non Af Amer: 126 mL/min/{1.73_m2} (ref >59)
Globulin, Total: 2.6 g/dL (ref 1.5–4.5)
Glucose: 108 mg/dL — ABNORMAL HIGH (ref 65–99)
Potassium: 4.3 mmol/L (ref 3.5–5.2)
Sodium: 138 mmol/L (ref 134–144)
Total Protein: 6.1 g/dL (ref 6.0–8.5)

## 2017-11-08 LAB — CBC
Hematocrit: 39.5 % (ref 34.0–46.6)
Hemoglobin: 13.8 g/dL (ref 11.1–15.9)
MCH: 30 pg (ref 26.6–33.0)
MCHC: 34.9 g/dL (ref 31.5–35.7)
MCV: 86 fL (ref 79–97)
Platelets: 323 10*3/uL (ref 150–450)
RBC: 4.6 x10E6/uL (ref 3.77–5.28)
RDW: 13 % (ref 12.3–15.4)
WBC: 11.8 10*3/uL — ABNORMAL HIGH (ref 3.4–10.8)

## 2017-11-08 LAB — PROTEIN / CREATININE RATIO, URINE
Creatinine, Urine: 30.5 mg/dL
Protein, Ur: 7.9 mg/dL
Protein/Creat Ratio: 259 mg/g creat — ABNORMAL HIGH (ref 0–200)

## 2017-11-08 NOTE — Telephone Encounter (Signed)
Patient called in and stated that she could not make her nurse visit appointment today to check her BP because she had to go out of town for work. Pt states she checked her BP at home and it was 136/90 around 1 pm. Denies contractions, reports good fetal movement, denies headache or vision changes. Pt instructed to reschedule and come in tomorrow for BP check before office closes, pt verbalized understanding.

## 2017-11-09 ENCOUNTER — Ambulatory Visit: Payer: Medicaid Other | Admitting: *Deleted

## 2017-11-09 VITALS — BP 132/82 | HR 82 | Wt 157.0 lb

## 2017-11-09 DIAGNOSIS — O163 Unspecified maternal hypertension, third trimester: Secondary | ICD-10-CM

## 2017-11-09 DIAGNOSIS — Z3400 Encounter for supervision of normal first pregnancy, unspecified trimester: Secondary | ICD-10-CM

## 2017-11-09 LAB — CERVICOVAGINAL ANCILLARY ONLY
Chlamydia: NEGATIVE
Neisseria Gonorrhea: NEGATIVE
Trichomonas: NEGATIVE

## 2017-11-09 LAB — STREP GP B NAA: Strep Gp B NAA: NEGATIVE

## 2017-11-09 NOTE — Progress Notes (Signed)
I have reviewed this chart and agree with the RN/CMA assessment and management.    K. Meryl Tamanna Whitson, M.D. [facility]  

## 2017-11-09 NOTE — Progress Notes (Signed)
Subjective:  Cassandra Marsh is a 32 y.o. female here for BP check.   Hypertension ROS: taking medications as instructed, no medication side effects noted, no TIA's, no chest pain on exertion, no dyspnea on exertion and no swelling of ankles.  Pt states slight HA today but says it feels like sinus HA.   Objective:  BP 132/82   Pulse 82   Wt 157 lb (71.2 kg)   LMP 02/28/2017   BMI 28.72 kg/m   Appearance alert, well appearing, and in no distress. General exam BP noted to be well controlled today in office.    Assessment:   Blood Pressure stable.   Plan:  Current treatment plan is effective, no change in therapy.Marland Kitchen

## 2017-11-14 ENCOUNTER — Encounter (HOSPITAL_COMMUNITY): Payer: Self-pay | Admitting: *Deleted

## 2017-11-14 ENCOUNTER — Other Ambulatory Visit: Payer: Self-pay

## 2017-11-14 ENCOUNTER — Ambulatory Visit (HOSPITAL_COMMUNITY): Admission: RE | Admit: 2017-11-14 | Payer: Medicaid Other | Source: Ambulatory Visit

## 2017-11-14 ENCOUNTER — Inpatient Hospital Stay (HOSPITAL_COMMUNITY)
Admission: AD | Admit: 2017-11-14 | Discharge: 2017-11-17 | DRG: 807 | Disposition: A | Discharge status: Home / Self Care | Payer: Medicaid Other | Attending: Obstetrics & Gynecology | Admitting: Obstetrics & Gynecology

## 2017-11-14 ENCOUNTER — Ambulatory Visit (INDEPENDENT_AMBULATORY_CARE_PROVIDER_SITE_OTHER): Payer: Medicaid Other | Admitting: Advanced Practice Midwife

## 2017-11-14 VITALS — BP 158/92 | HR 86 | Wt 160.0 lb

## 2017-11-14 DIAGNOSIS — O99334 Smoking (tobacco) complicating childbirth: Secondary | ICD-10-CM | POA: Diagnosis present

## 2017-11-14 DIAGNOSIS — F17210 Nicotine dependence, cigarettes, uncomplicated: Secondary | ICD-10-CM | POA: Diagnosis present

## 2017-11-14 DIAGNOSIS — O133 Gestational [pregnancy-induced] hypertension without significant proteinuria, third trimester: Secondary | ICD-10-CM

## 2017-11-14 DIAGNOSIS — O24424 Gestational diabetes mellitus in childbirth, insulin controlled: Secondary | ICD-10-CM | POA: Diagnosis present

## 2017-11-14 DIAGNOSIS — F1290 Cannabis use, unspecified, uncomplicated: Secondary | ICD-10-CM | POA: Diagnosis present

## 2017-11-14 DIAGNOSIS — Z3A37 37 weeks gestation of pregnancy: Secondary | ICD-10-CM | POA: Diagnosis not present

## 2017-11-14 DIAGNOSIS — O139 Gestational [pregnancy-induced] hypertension without significant proteinuria, unspecified trimester: Secondary | ICD-10-CM | POA: Insufficient documentation

## 2017-11-14 DIAGNOSIS — O134 Gestational [pregnancy-induced] hypertension without significant proteinuria, complicating childbirth: Principal | ICD-10-CM | POA: Diagnosis present

## 2017-11-14 DIAGNOSIS — O99324 Drug use complicating childbirth: Secondary | ICD-10-CM | POA: Diagnosis present

## 2017-11-14 DIAGNOSIS — Z3400 Encounter for supervision of normal first pregnancy, unspecified trimester: Secondary | ICD-10-CM

## 2017-11-14 DIAGNOSIS — O24429 Gestational diabetes mellitus in childbirth, unspecified control: Secondary | ICD-10-CM | POA: Diagnosis not present

## 2017-11-14 DIAGNOSIS — O99333 Smoking (tobacco) complicating pregnancy, third trimester: Secondary | ICD-10-CM

## 2017-11-14 DIAGNOSIS — O163 Unspecified maternal hypertension, third trimester: Secondary | ICD-10-CM

## 2017-11-14 DIAGNOSIS — O99330 Smoking (tobacco) complicating pregnancy, unspecified trimester: Secondary | ICD-10-CM | POA: Diagnosis present

## 2017-11-14 DIAGNOSIS — O24414 Gestational diabetes mellitus in pregnancy, insulin controlled: Secondary | ICD-10-CM

## 2017-11-14 DIAGNOSIS — O24919 Unspecified diabetes mellitus in pregnancy, unspecified trimester: Secondary | ICD-10-CM | POA: Diagnosis present

## 2017-11-14 DIAGNOSIS — O0993 Supervision of high risk pregnancy, unspecified, third trimester: Secondary | ICD-10-CM

## 2017-11-14 HISTORY — DX: Anxiety disorder, unspecified: F41.9

## 2017-11-14 HISTORY — DX: Mental disorder, not otherwise specified: F99

## 2017-11-14 HISTORY — DX: Type 2 diabetes mellitus without complications: E11.9

## 2017-11-14 HISTORY — DX: Essential (primary) hypertension: I10

## 2017-11-14 LAB — COMPREHENSIVE METABOLIC PANEL
ALBUMIN: 3.1 g/dL — AB (ref 3.5–5.0)
ALT: 12 U/L (ref 0–44)
ANION GAP: 12 (ref 5–15)
AST: 19 U/L (ref 15–41)
Alkaline Phosphatase: 166 U/L — ABNORMAL HIGH (ref 38–126)
BILIRUBIN TOTAL: 0.5 mg/dL (ref 0.3–1.2)
BUN: 9 mg/dL (ref 6–20)
CHLORIDE: 104 mmol/L (ref 98–111)
CO2: 20 mmol/L — ABNORMAL LOW (ref 22–32)
Calcium: 9.6 mg/dL (ref 8.9–10.3)
Creatinine, Ser: 0.6 mg/dL (ref 0.44–1.00)
GFR calc Af Amer: >60 mL/min (ref >60)
GFR calc non Af Amer: >60 mL/min (ref >60)
GLUCOSE: 116 mg/dL — AB (ref 70–99)
POTASSIUM: 4.3 mmol/L (ref 3.5–5.1)
Sodium: 136 mmol/L (ref 135–145)
TOTAL PROTEIN: 6.3 g/dL — AB (ref 6.5–8.1)

## 2017-11-14 LAB — TYPE AND SCREEN
ABO/RH(D): B POS
ANTIBODY SCREEN: NEGATIVE

## 2017-11-14 LAB — RAPID URINE DRUG SCREEN, HOSP PERFORMED
AMPHETAMINES: NOT DETECTED
BARBITURATES: NOT DETECTED
BENZODIAZEPINES: NOT DETECTED
Cocaine: NOT DETECTED
Opiates: NOT DETECTED
TETRAHYDROCANNABINOL: POSITIVE — AB

## 2017-11-14 LAB — PROTEIN / CREATININE RATIO, URINE
CREATININE, URINE: 90 mg/dL
PROTEIN CREATININE RATIO: 0.17 mg/mg{creat} — AB (ref 0.00–0.15)
Total Protein, Urine: 15 mg/dL

## 2017-11-14 LAB — CBC
HEMATOCRIT: 43.5 % (ref 36.0–46.0)
Hemoglobin: 14.2 g/dL (ref 12.0–15.0)
MCH: 30.1 pg (ref 26.0–34.0)
MCHC: 32.6 g/dL (ref 30.0–36.0)
MCV: 92.2 fL (ref 80.0–100.0)
Platelets: 291 10*3/uL (ref 150–400)
RBC: 4.72 MIL/uL (ref 3.87–5.11)
RDW: 14.3 % (ref 11.5–15.5)
WBC: 12.7 10*3/uL — AB (ref 4.0–10.5)
nRBC: 0 % (ref 0.0–0.2)

## 2017-11-14 LAB — GLUCOSE, CAPILLARY
GLUCOSE-CAPILLARY: 136 mg/dL — AB (ref 70–99)
Glucose-Capillary: 32 mg/dL — CL (ref 70–99)
Glucose-Capillary: 78 mg/dL (ref 70–99)
Glucose-Capillary: 93 mg/dL (ref 70–99)

## 2017-11-14 LAB — ABO/RH: ABO/RH(D): B POS

## 2017-11-14 MED ORDER — ONDANSETRON HCL 4 MG/2ML IJ SOLN
4.0000 mg | Freq: Four times a day (QID) | INTRAMUSCULAR | Status: DC | PRN
  Administered 2017-11-15: 4 mg via INTRAVENOUS
  Filled 2017-11-14: qty 2

## 2017-11-14 MED ORDER — MISOPROSTOL 50MCG HALF TABLET
50.0000 ug | ORAL_TABLET | ORAL | Status: DC
  Administered 2017-11-14 – 2017-11-15 (×2): 50 ug via BUCCAL
  Filled 2017-11-14 (×5): qty 1

## 2017-11-14 MED ORDER — OXYCODONE-ACETAMINOPHEN 5-325 MG PO TABS: 1.0000 | ORAL_TABLET | ORAL | Status: DC | PRN

## 2017-11-14 MED ORDER — LACTATED RINGERS IV SOLN
INTRAVENOUS | Status: DC
  Administered 2017-11-14 – 2017-11-15 (×5): via INTRAVENOUS

## 2017-11-14 MED ORDER — LACTATED RINGERS IV SOLN: 500.0000 mL | INTRAVENOUS | Status: DC | PRN

## 2017-11-14 MED ORDER — SOD CITRATE-CITRIC ACID 500-334 MG/5ML PO SOLN: 30.0000 mL | ORAL | Status: DC | PRN

## 2017-11-14 MED ORDER — LIDOCAINE HCL (PF) 1 % IJ SOLN
30.0000 mL | INTRAMUSCULAR | Status: DC | PRN
  Filled 2017-11-14: qty 30

## 2017-11-14 MED ORDER — OXYTOCIN BOLUS FROM INFUSION
500.0000 mL | Freq: Once | INTRAVENOUS | Status: AC
  Administered 2017-11-15: 500 mL via INTRAVENOUS

## 2017-11-14 MED ORDER — OXYCODONE-ACETAMINOPHEN 5-325 MG PO TABS: 2.0000 | ORAL_TABLET | ORAL | Status: DC | PRN

## 2017-11-14 MED ORDER — OXYTOCIN 40 UNITS IN LACTATED RINGERS INFUSION - SIMPLE MED: 2.5000 [IU]/h | INTRAVENOUS | Status: DC

## 2017-11-14 MED ORDER — ACETAMINOPHEN 325 MG PO TABS: 650.0000 mg | ORAL_TABLET | ORAL | Status: DC | PRN

## 2017-11-14 MED ORDER — FLEET ENEMA 7-19 GM/118ML RE ENEM: 1.0000 | ENEMA | RECTAL | Status: DC | PRN

## 2017-11-14 NOTE — Progress Notes (Signed)
Hypoglycemic Event  CBG: 32  Treatment: 15 GM carbohydrate snack  Symptoms: None  Follow-up CBG: Time:2125 CBG Result:78  Possible Reasons for Event: Unknown  Comments/MD notified: Dr Annia Friendly and Dr Aneta Mins notified. Re-check at 2300.    Cassandra Marsh

## 2017-11-14 NOTE — Progress Notes (Signed)
Pt is here for ROB. G1P0 [redacted]w[redacted]d.

## 2017-11-14 NOTE — Progress Notes (Signed)
LABOR PROGRESS NOTE  Sidda Humm is a 32 y.o. G1P0000 at [redacted]w[redacted]d  admitted for IOL due to [facility].    Subjective: Strip note.   Objective: BP (!) 159/82   Pulse 73   Temp 97.8 F (36.6 C) (Oral)   Resp 17   Ht 5\' 2"  (1.575 m)   Wt 72.7 kg   LMP 02/28/2017   BMI 29.30 kg/m  or  Vitals:   11/14/17 2001 11/14/17 2100 11/14/17 2108 11/14/17 2116  BP: (!) 141/91 (!) 158/85 (!) 167/88 (!) 159/82  Pulse: 73 76 77 73  Resp: 17 16 16 17   Temp:      TempSrc:      Weight:      Height:        Dilation: 1.5 Effacement (%): 60 Cervical Position: Middle Station: -2 Presentation: Vertex Exam by:: Donette Larry, CNM  FHT: Baseline 130's, mod var, + accel, - decel  Toco: irregular, every 3-6 min   Labs: Lab Results  Component Value Date   WBC 12.7 (H) 11/14/2017   HGB 14.2 11/14/2017   HCT 43.5 11/14/2017   MCV 92.2 11/14/2017   PLT 291 11/14/2017    Patient Active Problem List   Diagnosis Date Noted  . Gestational hypertension 11/14/2017  . Insulin controlled gestational diabetes mellitus (GDM) in third trimester 10/18/2017  . Diabetes mellitus during pregnancy, antepartum 10/11/2017  . Abnormal glucose tolerance test (GTT) during pregnancy, antepartum 09/28/2017  . Tobacco smoking affecting pregnancy, antepartum 06/19/2017  . Supervision of normal first pregnancy, antepartum 05/22/2017    Assessment / Plan: 32 y.o. G1P0000 at [redacted]w[redacted]d here for IOL due to [facility].   Labor: IOL with FB and cytotec. FB still in place. Serial cervical exams.  Fetal Wellbeing:  Cat 1 strip  Pain Control:  IV fent as needed  Anticipated MOD:  NSVD   1. GHTN: Average moderate range pressures, 1 severe range (167/88) right after a contraction. PIH labs wnl. Asymptomatic. Will monitor closely, likely start Mg if additional severe range pressure.   -Monitor BP, IV labetalol PRN, low threshold for starting Mg   2. A2GDM: Asymptomatic low of 32 earlier, given juice and dinner. Recheck 78. Will  check q2 for one more, if continues to remain stable, space back to q4 CBGs.   3. Substance use: UDS positive for THC. Endorsed 1/2 PPD current smoker and alcohol use during pregnancy as well.   -SW consult placed   Leticia Penna, D.O. 11/14/2017, 10:33 PM

## 2017-11-14 NOTE — H&P (Addendum)
LABOR AND DELIVERY ADMISSION HISTORY AND PHYSICAL NOTE  Junior Kenedy is a 32 y.o. female G1P0000 with IUP at [redacted]w[redacted]d by LMP presenting for IOL 2/2 gHTN. Pt was being seen at [facility] for prenatal visit today and found to have elevated BP readings x2 (157/90, 158/92). Reports sinus pressure, no HA. She has noticed worsening LE edema over the past few days. No associated vision changes, abdominal pain.   She reports positive fetal movement. She denies leakage of fluid or vaginal bleeding.  Prenatal History/Complications: PNC at [facility] Pregnancy complications:  - GDM (controlled with insulin) - Daily tobacco-use (~1/2 pack/day), occasional marijuana and alcohol use throughout pregnancy - New onset gHTN   Sono: 33wks 69 %ile 4'14  Past Medical History: Past Medical History:  Diagnosis Date  . Anxiety   . Diabetes mellitus without complication (HCC)   . Hypertension   . Mental disorder     Past Surgical History: Past Surgical History:  Procedure Laterality Date  . TONSILLECTOMY  2004    Obstetrical History: OB History    Gravida  1   Para  0   Term  0   Preterm  0   AB  0   Living  0     SAB  0   TAB  0   Ectopic  0   Multiple  0   Live Births  0           Social History: Social History   Socioeconomic History  . Marital status: Legally Separated    Spouse name: Not on file  . Number of children: Not on file  . Years of education: Not on file  . Highest education level: Not on file  Occupational History  . Not on file  Social Needs  . Financial resource strain: Not on file  . Food insecurity:    Worry: Not on file    Inability: Not on file  . Transportation needs:    Medical: Not on file    Non-medical: Not on file  Tobacco Use  . Smoking status: Current Every Day Smoker    Packs/day: 0.25    Types: Cigarettes  . Smokeless tobacco: Never Used  Substance and Sexual Activity  . Alcohol use: Not Currently  . Drug use: Not Currently  . Sexual  activity: Yes    Birth control/protection: None  Lifestyle  . Physical activity:    Days per week: Not on file    Minutes per session: Not on file  . Stress: Not on file  Relationships  . Social connections:    Talks on phone: Not on file    Gets together: Not on file    Attends religious service: Not on file    Active member of club or organization: Not on file    Attends meetings of clubs or organizations: Not on file    Relationship status: Not on file  Other Topics Concern  . Not on file  Social History Narrative  . Not on file    Family History: History reviewed. No pertinent family history.  Allergies: No Known Allergies  Medications Prior to Admission  Medication Sig Dispense Refill Last Dose  . ACCU-CHEK FASTCLIX LANCETS MISC 1 each by Percutaneous route 4 (four) times daily. 100 each 5 11/13/2017  . diphenhydrAMINE HCl (BENADRYL PO) Take by mouth.   11/13/2017  . glucose blood (ACCU-CHEK GUIDE) test strip Use one test strip to check blood glucose 4 times daily. 100 each 5 11/13/2017  .  insulin lispro (HUMALOG) 100 UNIT/ML injection Inject 0.1 mLs (10 Units total) into the skin daily before breakfast AND 0.08 mLs (8 Units total) daily before supper. 10 mL 1 11/13/2017  . insulin NPH Human (HUMULIN N,NOVOLIN N) 100 UNIT/ML injection Inject 0.1 mLs (10 Units total) into the skin daily before breakfast AND 0.08 mLs (8 Units total) at bedtime. 10 mL 1 11/13/2017  . Insulin Syringe-Needle U-100 25G X 1" 1 ML MISC 1 Syringe by Does not apply route as needed. 100 each 2 11/13/2017  . Prenat-Fe Poly-Methfol-FA-DHA (VITAFOL ULTRA) 29-0.6-0.4-200 MG CAPS Take 1 capsule by mouth daily. 30 capsule 12 11/13/2017  . Prenatal Vit-Fe Fumarate-FA (PRENATAL MULTIVITAMIN) TABS tablet Take 1 tablet by mouth daily at 12 noon.   11/13/2017  . promethazine (PHENERGAN) 12.5 MG tablet TAKE 1 TABLET BY MOUTH EVERY 8 HOURS 30 tablet 0 11/13/2017     Review of Systems  All systems reviewed and negative  except as stated in HPI  Physical Exam Blood pressure (!) 153/83, pulse 90, temperature 97.9 F (36.6 C), temperature source Oral, resp. rate 17, height 5\' 2"  (1.575 m), weight 72.7 kg, last menstrual period 02/28/2017. General appearance: alert, oriented, NAD Lungs: normal respiratory effort Heart: regular rate Abdomen: soft, non-tender; gravid, FH appropriate for GA Extremities: No calf swelling or tenderness Presentation: cephalic Fetal monitoring: Baseline FHR 130 bpm with moderate variability, +accelerations, no decelerations Uterine activity: Irritability with occasional contraction Dilation: 1 Effacement (%): Thick Station: -3 Exam by:: Dr. Darin Engels  Prenatal labs: ABO, Rh: B/Positive/-- (05/14 1423) Antibody: Negative (05/14 1423) Rubella: 3.29 (05/14 1423) RPR: Non Reactive (09/03 0946)  HBsAg: Negative (05/14 1423)  HIV: Non Reactive (09/03 0946)  GC/Chlamydia: Negative GBS: Negative (10/30 1711)  2-hr GTT: Elevated; pt with gDM controlled on insulin Genetic screening:  Low risk Panorama Screening Anatomy US: Normal  Prenatal Transfer Tool  Maternal Diabetes: Yes:  Diabetes Type:  Insulin/Medication controlled Genetic Screening: Normal Maternal Ultrasounds/Referrals: Normal Fetal Ultrasounds or other Referrals:  Referred to Materal Fetal Medicine  Maternal Substance Abuse:  Yes:  Type: Smoker, Marijuana, Other: Occasional alcohol (one glass of wine "every now and then" throughout pregnancy) Significant Maternal Medications:  None Significant Maternal Lab Results: None  Results for orders placed or performed during the hospital encounter of 11/14/17 (from the past 24 hour(s))  Glucose, capillary   Collection Time: 11/14/17  6:12 PM  Result Value Ref Range   Glucose-Capillary 136 (H) 70 - 99 mg/dL   Comment 1 Notify RN   CBC   Collection Time: 11/14/17  6:15 PM  Result Value Ref Range   WBC 12.7 (H) 4.0 - 10.5 K/uL   RBC 4.72 3.87 - 5.11 MIL/uL   Hemoglobin  14.2 12.0 - 15.0 g/dL   HCT 16.1 09.6 - 04.5 %   MCV 92.2 80.0 - 100.0 fL   MCH 30.1 26.0 - 34.0 pg   MCHC 32.6 30.0 - 36.0 g/dL   RDW 40.9 81.1 - 91.4 %   Platelets 291 150 - 400 K/uL   nRBC 0.0 0.0 - 0.2 %    Patient Active Problem List   Diagnosis Date Noted  . Gestational hypertension 11/14/2017  . Insulin controlled gestational diabetes mellitus (GDM) in third trimester 10/18/2017  . Diabetes mellitus during pregnancy, antepartum 10/11/2017  . Abnormal glucose tolerance test (GTT) during pregnancy, antepartum 09/28/2017  . Tobacco smoking affecting pregnancy, antepartum 06/19/2017  . Supervision of normal first pregnancy, antepartum 05/22/2017    Assessment: Shanayah Kaffenberger is a  32 y.o. G1P0000 at [redacted]w[redacted]d here for IOL for gHTN.   #Labor:   -- Strip indicates irritability with occasional uterine contraction. Baseline FHR 130 bpm with moderate variability,  +accelerations, -decelerations  -- Starting cytotec 50 mcg buccal q4hrs  -- Will attempt placement of foley bulb given cervical exam indicating 1 cm dilation  -- UPC pending; CTM BP q1hr  #Pain: IV fentanyl +/- nitrous oxide   #A2GDM - BG 136 on admission, will recheck in 2 hrs then q4hr checks  -- will start SSI as indicated after next check   #ID:   GBS (-) #MOF:  Breast #MOC: Undecided #Circ:  Outpatient circ  Hal Neer 11/14/2017, 6:47 PM  Patient seen and examined in conjunction with the medical student.  Burman Nieves, MD Family Medicine Resident, Faculty Service  Midwife attestation: I have seen and examined this patient; I agree with above documentation in the resident's note.   PE: Gen: calm comfortable, NAD Resp: normal effort and rate Abd: gravid  ROS, labs, PMH reviewed  Assessment/Plan: Willodean Leven is a 32 y.o. G1P0000 here for IOL for gHTN Admit to LD Labor: latent FWB: Cat I ID: GBS neg Cytotec and FB then Pitocin, CBGs q4  Donette Larry, CNM  11/14/2017, 7:56 PM

## 2017-11-14 NOTE — Progress Notes (Signed)
   PRENATAL VISIT NOTE  Subjective:  Cassandra Marsh is a 32 y.o. G1P0 at [redacted]w[redacted]d being seen today for ongoing prenatal care.  She is currently monitored for the following issues for this high-risk pregnancy and has Supervision of normal first pregnancy, antepartum; Tobacco smoking affecting pregnancy, antepartum; Abnormal glucose tolerance test (GTT) during pregnancy, antepartum; Diabetes mellitus during pregnancy, antepartum; and Insulin controlled gestational diabetes mellitus (GDM) in third trimester on their problem list.  Patient reports headache.  Contractions: Irregular. Vag. Bleeding: None.  Movement: Present. Denies leaking of fluid.   The following portions of the patient's history were reviewed and updated as appropriate: allergies, current medications, past family history, past medical history, past social history, past surgical history and problem list. Problem list updated.  Objective:   Vitals:   11/14/17 1311 11/14/17 1314  BP: (!) 157/90 (!) 158/92  Pulse: 90 86  Weight: 160 lb (72.6 kg)     Fetal Status: Fetal Heart Rate (bpm): 140 Fundal Height: 38 cm Movement: Present  Presentation: Vertex  General:  Alert, oriented and cooperative. Patient is in no acute distress.  Skin: Skin is warm and dry. No rash noted.   Cardiovascular: Normal heart rate noted  Respiratory: Normal respiratory effort, no problems with respiration noted  Abdomen: Soft, gravid, appropriate for gestational age.  Pain/Pressure: Absent     Pelvic: Cervical exam deferred        Extremities: Normal range of motion.  Edema: Trace  Mental Status: Normal mood and affect. Normal behavior. Normal judgment and thought content.   Assessment and Plan:  Pregnancy: G1P0 at [redacted]w[redacted]d  1. Supervision of high risk pregnancy in third trimester   2. Hypertension affecting pregnancy in third trimester --Elevated BP x 2 today --159/77 at 27+6 GA, 164/73 at 36+0 GA' --PEC labs resulted 10/30 --Frontal headache  without visual disturbances today.  Took Tylenol yesterday without relief  3. Insulin controlled gestational diabetes mellitus (GDM) in third trimester --Endorses numbers within advised limits -Compliant with prescribed medications  4. Tobacco smoking affecting pregnancy, antepartum  Discussed with Dr. Ashok Pall, [facility] Attending. Patient to be directly admitted to [facility] for IOL for Gestational Hypertension at term. Possible management routes and plans of care discussed with patient and her mom, who verbalize understanding.  Patient lives 20 minutes from hospital and plans to report to Admissions after a short trip home to pack her hospital bag.  Calvert Cantor, CNM 11/14/17  2:02 PM

## 2017-11-15 ENCOUNTER — Inpatient Hospital Stay (HOSPITAL_COMMUNITY): Payer: Medicaid Other | Admitting: Anesthesiology

## 2017-11-15 LAB — GLUCOSE, CAPILLARY
GLUCOSE-CAPILLARY: 93 mg/dL (ref 70–99)
Glucose-Capillary: 105 mg/dL — ABNORMAL HIGH (ref 70–99)
Glucose-Capillary: 73 mg/dL (ref 70–99)
Glucose-Capillary: 78 mg/dL (ref 70–99)
Glucose-Capillary: 81 mg/dL (ref 70–99)
Glucose-Capillary: 88 mg/dL (ref 70–99)

## 2017-11-15 LAB — RPR: RPR: NONREACTIVE

## 2017-11-15 LAB — CBC
HEMATOCRIT: 44.2 % (ref 36.0–46.0)
Hemoglobin: 14.5 g/dL (ref 12.0–15.0)
MCH: 30.3 pg (ref 26.0–34.0)
MCHC: 32.8 g/dL (ref 30.0–36.0)
MCV: 92.5 fL (ref 80.0–100.0)
Platelets: 252 10*3/uL (ref 150–400)
RBC: 4.78 MIL/uL (ref 3.87–5.11)
RDW: 14.5 % (ref 11.5–15.5)
WBC: 21.5 10*3/uL — ABNORMAL HIGH (ref 4.0–10.5)
nRBC: 0 % (ref 0.0–0.2)

## 2017-11-15 MED ORDER — NICOTINE 21 MG/24HR TD PT24
21.0000 mg | MEDICATED_PATCH | Freq: Every day | TRANSDERMAL | Status: DC
  Administered 2017-11-15: 21 mg via TRANSDERMAL
  Filled 2017-11-15 (×2): qty 1

## 2017-11-15 MED ORDER — EPHEDRINE 5 MG/ML INJ
10.0000 mg | INTRAVENOUS | Status: DC | PRN
  Filled 2017-11-15: qty 2

## 2017-11-15 MED ORDER — TRANEXAMIC ACID-NACL 1000-0.7 MG/100ML-% IV SOLN
1000.0000 mg | Freq: Once | INTRAVENOUS | Status: AC
  Administered 2017-11-15: 1000 mg via INTRAVENOUS

## 2017-11-15 MED ORDER — OXYTOCIN 40 UNITS IN LACTATED RINGERS INFUSION - SIMPLE MED
1.0000 m[IU]/min | INTRAVENOUS | Status: DC
  Administered 2017-11-15: 2 m[IU]/min via INTRAVENOUS

## 2017-11-15 MED ORDER — LIDOCAINE HCL (PF) 1 % IJ SOLN
INTRAMUSCULAR | Status: DC | PRN
  Administered 2017-11-15 (×2): 5 mL via EPIDURAL

## 2017-11-15 MED ORDER — OXYTOCIN 40 UNITS IN LACTATED RINGERS INFUSION - SIMPLE MED
INTRAVENOUS | Status: AC
  Administered 2017-11-15: 500 mL via INTRAVENOUS
  Filled 2017-11-15: qty 1000

## 2017-11-15 MED ORDER — LACTATED RINGERS AMNIOINFUSION
INTRAVENOUS | Status: DC
  Administered 2017-11-15 (×2): via INTRAUTERINE
  Filled 2017-11-15 (×2): qty 1000

## 2017-11-15 MED ORDER — PHENYLEPHRINE 40 MCG/ML (10ML) SYRINGE FOR IV PUSH (FOR BLOOD PRESSURE SUPPORT)
80.0000 ug | PREFILLED_SYRINGE | INTRAVENOUS | Status: DC | PRN
  Administered 2017-11-15: 80 ug via INTRAVENOUS
  Filled 2017-11-15: qty 5

## 2017-11-15 MED ORDER — DIPHENHYDRAMINE HCL 50 MG/ML IJ SOLN
12.5000 mg | INTRAMUSCULAR | Status: DC | PRN
  Administered 2017-11-15: 12.5 mg via INTRAVENOUS
  Filled 2017-11-15: qty 1

## 2017-11-15 MED ORDER — MISOPROSTOL 200 MCG PO TABS
ORAL_TABLET | ORAL | Status: AC
  Filled 2017-11-15: qty 4

## 2017-11-15 MED ORDER — LACTATED RINGERS IV SOLN
500.0000 mL | Freq: Once | INTRAVENOUS | Status: AC
  Administered 2017-11-15: 500 mL via INTRAVENOUS

## 2017-11-15 MED ORDER — PHENYLEPHRINE 40 MCG/ML (10ML) SYRINGE FOR IV PUSH (FOR BLOOD PRESSURE SUPPORT)
80.0000 ug | PREFILLED_SYRINGE | INTRAVENOUS | Status: DC | PRN
  Filled 2017-11-15: qty 10
  Filled 2017-11-15: qty 5

## 2017-11-15 MED ORDER — FENTANYL CITRATE (PF) 100 MCG/2ML IJ SOLN
100.0000 ug | INTRAMUSCULAR | Status: DC | PRN
  Administered 2017-11-15 (×6): 100 ug via INTRAVENOUS
  Filled 2017-11-15 (×5): qty 2

## 2017-11-15 MED ORDER — TRANEXAMIC ACID-NACL 1000-0.7 MG/100ML-% IV SOLN
INTRAVENOUS | Status: AC
  Administered 2017-11-15: 1000 mg via INTRAVENOUS
  Filled 2017-11-15: qty 100

## 2017-11-15 MED ORDER — FENTANYL CITRATE (PF) 100 MCG/2ML IJ SOLN
INTRAMUSCULAR | Status: AC
  Filled 2017-11-15: qty 2

## 2017-11-15 MED ORDER — FENTANYL 2.5 MCG/ML BUPIVACAINE 1/10 % EPIDURAL INFUSION (WH - ANES)
14.0000 mL/h | INTRAMUSCULAR | Status: DC | PRN
  Administered 2017-11-15: 14 mL/h via EPIDURAL
  Filled 2017-11-15: qty 100

## 2017-11-15 MED ORDER — TERBUTALINE SULFATE 1 MG/ML IJ SOLN
0.2500 mg | Freq: Once | INTRAMUSCULAR | Status: DC | PRN
  Filled 2017-11-15: qty 1

## 2017-11-15 NOTE — Anesthesia Procedure Notes (Signed)
Epidural Patient location during procedure: OB Start time: 11/15/2017 6:27 PM End time: 11/15/2017 6:37 PM  Staffing Anesthesiologist: Leonides Grills, MD Performed: anesthesiologist   Preanesthetic Checklist Completed: patient identified, site marked, pre-op evaluation, timeout performed, IV checked, risks and benefits discussed and monitors and equipment checked  Epidural Patient position: sitting Prep: DuraPrep Patient monitoring: heart rate, cardiac monitor, continuous pulse ox and blood pressure Approach: midline Location: L3-L4 Injection technique: LOR air  Needle:  Needle type: Tuohy  Needle gauge: 17 G Needle length: 9 cm Needle insertion depth: 6 cm Catheter type: closed end flexible Catheter size: 19 Gauge Catheter at skin depth: 11 cm Test dose: negative and Other  Assessment Events: blood not aspirated, injection not painful, no injection resistance and negative IV test  Additional Notes Informed consent obtained prior to proceeding including risk of failure, 1% risk of PDPH, risk of minor discomfort and bruising. Discussed alternatives to epidural analgesia and patient desires to proceed.  Timeout performed pre-procedure verifying patient name, procedure, and platelet count.  Patient tolerated procedure well. Reason for block:procedure for pain

## 2017-11-15 NOTE — Progress Notes (Addendum)
   Cassandra Marsh is a 32 y.o. G1P0000 at [redacted]w[redacted]d  admitted for induction of labor due to Gestational diabetes on insulin and gestataional hypertension.   Subjective: Patient resting in bed.   Objective: Vitals:   11/15/17 0947 11/15/17 1012 11/15/17 1041 11/15/17 1113  BP: (!) 102/53 137/89 135/70 (!) 125/58  Pulse: 61 60 64 67  Resp: 20   20  Temp:      TempSrc:      Weight:      Height:       No intake/output data recorded.  FHT:  FHR: 145 bpm, variability: moderate,  accelerations:  Present,  decelerations:  Absent UC:   regular, every 2 minutes SVE:   Dilation: 4 Effacement (%): 50 Station: -2 Exam by:: Jonelle Sidle, RN Pitocin @ 16 mu/min  Labs: Lab Results  Component Value Date   WBC 12.7 (H) 11/14/2017   HGB 14.2 11/14/2017   HCT 43.5 11/14/2017   MCV 92.2 11/14/2017   PLT 291 11/14/2017    Assessment / Plan: making slow progress; will encourage patient to ambulate.   CBG normal; BP has been normal.     Labor: slow progress, still in early labor Fetal Wellbeing:  Category I Pain Control:  Labor support without medications Anticipated MOD:  NSVD  Marylene Land 11/15/2017, 11:47 AM

## 2017-11-15 NOTE — Progress Notes (Addendum)
   Jordis Repetto is a 32 y.o. G1P0000 at [redacted]w[redacted]d  admitted for induction of labor due to Gestational diabetes and Hypertension.  Subjective:  Feeling more pain with contractions;  Objective: Vitals:   11/15/17 1611 11/15/17 1637 11/15/17 1657 11/15/17 1721  BP: 138/70 133/68  (!) 149/70  Pulse: 71 65  (!) 56  Resp: 18 20    Temp:   97.7 F (36.5 C)   TempSrc:   Axillary   Weight:      Height:       No intake/output data recorded.  FHT:  FHR: 125 bpm, variability: marked,  accelerations:  Present,  decelerations:  Absent UC:   irregular, every  10-15 minutes SVE:   Dilation: 5 Effacement (%): 60 Station: -2 Exam by:: [email or web address] Pitocin @ 24 mu/min  Labs: Lab Results  Component Value Date   WBC 12.7 (H) 11/14/2017   HGB 14.2 11/14/2017   HCT 43.5 11/14/2017   MCV 92.2 11/14/2017   PLT 291 11/14/2017   Patient Vitals for the past 24 hrs:  BP Temp Temp src Pulse Resp  11/15/17 1721 (!) 149/70 - - (!) 56 -  11/15/17 1657 - 97.7 F (36.5 C) Axillary - -  11/15/17 1637 133/68 - - 65 20  11/15/17 1611 138/70 - - 71 18  11/15/17 1530 106/68 - - (!) 59 18  11/15/17 1442 119/78 97.8 F (36.6 C) Axillary 63 20  11/15/17 1344 121/79 - - 70 -  11/15/17 1309 132/71 - - 63 -  11/15/17 1306 - 98.1 F (36.7 C) - - 20  11/15/17 1238 140/72 - - (!) 53 -  11/15/17 1214 124/73 - - 60 -  11/15/17 1113 (!) 125/58 - - 67 20  11/15/17 1041 135/70 - - 64 -  11/15/17 1012 137/89 - - 60 -  11/15/17 0947 (!) 102/53 - - 61 20  11/15/17 0858 114/62 97.7 F (36.5 C) Oral 62 18  11/15/17 0827 116/66 - - 63 -  11/15/17 0755 125/76 - - 77 20  11/15/17 0719 (!) 105/55 - - 74 -  11/15/17 0645 (!) 118/50 - - 74 18  11/15/17 0643 - 98.1 F (36.7 C) Oral - -  11/15/17 0503 121/60 - - 69 17  11/15/17 0305 (!) 142/74 - - 73 -  11/15/17 0200 (!) 148/63 97.7 F (36.5 C) Oral 67 19  11/15/17 0058 (!) 131/56 - - 77 16  11/15/17 0014 132/76 - - 77 16  11/14/17 2303 (!) 133/52 97.8 F (36.6 C)  Oral 75 16  11/14/17 2211 (!) 147/81 - - 66 16  11/14/17 2116 (!) 159/82 - - 73 17  11/14/17 2108 (!) 167/88 - - 77 16  11/14/17 2100 (!) 158/85 - - 76 16  11/14/17 2001 (!) 141/91 - - 73 17  11/14/17 1920 127/63 - - 80 16  11/14/17 1910 - 97.8 F (36.6 C) Oral - -  11/14/17 1822 (!) 153/83 - - 90 17  11/14/17 1804 - 97.9 F (36.6 C) Oral - -   Blood sugar well controlled; BPs without severe range; no S/S of pre-e.     Assessment / Plan: slow progress, still 5 cm. AROM large amount of clear fluid.  IUPC placed.   Labor: Active labor Fetal Wellbeing:  Category I Pain Control:  Labor support without medications Anticipated MOD:  NSVD  Marylene Land 11/15/2017, 5:49 PM

## 2017-11-15 NOTE — Progress Notes (Signed)
LABOR PROGRESS NOTE  Cassandra Marsh is a 32 y.o. G1P0000 at [redacted]w[redacted]d  admitted for IOL due to [facility].  Subjective: Strip note.   Objective: BP (!) 142/74   Pulse 73   Temp 97.7 F (36.5 C) (Oral)   Resp 19   Ht 5\' 2"  (1.575 m)   Wt 72.7 kg   LMP 02/28/2017   BMI 29.30 kg/m  or  Vitals:   11/15/17 0014 11/15/17 0058 11/15/17 0200 11/15/17 0305  BP: 132/76 (!) 131/56 (!) 148/63 (!) 142/74  Pulse: 77 77 67 73  Resp: 16 16 19    Temp:   97.7 F (36.5 C)   TempSrc:   Oral   Weight:      Height:         Dilation: 3.5 Effacement (%): 50 Cervical Position: Middle Station: -2 Presentation: Vertex Exam by:: Jonelle Sidle, RN Toco: irregular, irritable   Labs: Lab Results  Component Value Date   WBC 12.7 (H) 11/14/2017   HGB 14.2 11/14/2017   HCT 43.5 11/14/2017   MCV 92.2 11/14/2017   PLT 291 11/14/2017    Patient Active Problem List   Diagnosis Date Noted  . Gestational hypertension 11/14/2017  . Insulin controlled gestational diabetes mellitus (GDM) in third trimester 10/18/2017  . Diabetes mellitus during pregnancy, antepartum 10/11/2017  . Abnormal glucose tolerance test (GTT) during pregnancy, antepartum 09/28/2017  . Tobacco smoking affecting pregnancy, antepartum 06/19/2017  . Supervision of normal first pregnancy, antepartum 05/22/2017    Assessment / Plan: 32 y.o. G1P0000 at [redacted]w[redacted]d here for IOL due to [facility]   Labor: IOL, FB out- start pit 2x2.  Fetal Wellbeing:  Cat 1 strip  Pain Control:  IV fent PRN  Anticipated MOD:  NSVD   1.GHTN: Stable, mild to moderate range. Last BP 142/74. Asymptomatic.   -Monitor BP   2. A2GDM: Stable, last CBG 93.   -q4 CBG during latent labor   Leticia Penna, D.O. Family Medicine PGY-1   11/15/2017, 5:07 AM

## 2017-11-15 NOTE — Progress Notes (Signed)
Inpatient Diabetes Program Recommendations  AACE/ADA: New Consensus Statement on Inpatient Glycemic Control (2015)  Target Ranges:  Prepandial:   less than 140 mg/dL      Peak postprandial:   less than 180 mg/dL (1-2 hours)      Critically ill patients:  140 - 180 mg/dL   Lab Results  Component Value Date   GLUCAP 88 11/15/2017    Review of Glycemic Control Results for Cassandra Marsh, Cassandra Marsh (MRN [id number]) as of 11/15/2017 09:17  Ref. Range 11/14/2017 18:12 11/14/2017 20:58 11/14/2017 21:25 11/14/2017 23:04 11/15/2017 00:56 11/15/2017 05:04  Glucose-Capillary Latest Ref Range: 70 - 99 mg/dL 409 (H) 32 (LL) 78 93 811 (H) 93   Diabetes history: GDM Outpatient Diabetes medications: Humalog 10 units QAM/ 8 units QPM, Humulin NPH 10 units QAM/ 8 units QPM Current orders for Inpatient glycemic control: none, CBGs Q4H  Inpatient Diabetes Program Recommendations:    Noted severe hypoglycemic episode of 32 mg/dL with initiation of hypoglycemia protocol. Other CBGs are within inpatient goals. It was noted that patient had not eaten and assuming patient had received AM dosing of NPH and Humalog. No additional recommendations. Will continue to follow.   Thanks, Lujean Rave, MSN, RNC-OB Diabetes Coordinator [phone] (8a-5p)

## 2017-11-15 NOTE — Progress Notes (Signed)
OB/GYN Faculty Practice: Labor Progress Note  Subjective: Extremely uncomfortable during contractions.   Objective: BP (!) 146/45   Pulse (!) 104   Temp 97.7 F (36.5 C) (Axillary)   Resp 20   Ht 5\' 2"  (1.575 m)   Wt 72.7 kg   LMP 02/28/2017   BMI 29.30 kg/m  Gen: severe distress  Dilation: 6.5 Effacement (%): 100 Cervical Position: Middle Station: -1 Presentation: Vertex Exam by:: sowder  Assessment and Plan: 32 y.o. G1P0000 [redacted]w[redacted]d admitted for IOL 2/2 gHTN.   Labor: continue pitocin, decreasing rate due to frequent contractions -- s/p AROM, FB, cytotec x 2 -- pain control: IV fentanyl, requesting epidural, CBC pending   gHTN - some moderate range pressures overnight, several more just now but likely 2/2 elevated pain and frequent contractions  - PIH labs were normal   Fetal Well-Being: Cephalic by exam -- Category 1 since FSE placement  -- GBS negative  Burman Nieves, MD Family Medicine Resident 6:21 PM

## 2017-11-15 NOTE — Progress Notes (Signed)
   Cassandra Marsh is a 32 y.o. G1P0000 at [redacted]w[redacted]d  admitted for gestational hypertension and gestational diabetes on insulin.  Subjective:  Patient very uncomfortable with contractions; very distressed and requesting epidural.   Objective: Vitals:   11/15/17 1637 11/15/17 1657 11/15/17 1721 11/15/17 1800  BP: 133/68  (!) 149/70 (!) 146/45  Pulse: 65  (!) 56 (!) 104  Resp: 20  20   Temp:  97.7 F (36.5 C)    TempSrc:  Axillary    Weight:      Height:       No intake/output data recorded.  FHT:  FHR: 120 bpm, variability: moderate,  accelerations:  Present,  decelerations:  Present earlies UC:   Regular, every 2-3 minutes SVE:   Dilation: 6.5 Effacement (%): 100 Station: -1 Exam by:: sowder   Labs: Lab Results  Component Value Date   WBC 12.7 (H) 11/14/2017   HGB 14.2 11/14/2017   HCT 43.5 11/14/2017   MCV 92.2 11/14/2017   PLT 291 11/14/2017    Assessment / Plan: now 6 cm; IV fentanyl Lab at the bedside to draw CBC for epidural.    Labor: Progressing normally Fetal Wellbeing:  Category II; FSE placed; amnioinfusion in progress Pain Control:  planning epidural Anticipated MOD:  NSVD  Charlesetta Garibaldi Janis Sol 11/15/2017, 6:23 PM

## 2017-11-15 NOTE — Anesthesia Preprocedure Evaluation (Signed)
Anesthesia Evaluation  Patient identified by MRN, date of birth, ID band Patient awake    Reviewed: Allergy & Precautions, H&P , NPO status , Patient's Chart, lab work & pertinent test results  History of Anesthesia Complications Negative for: history of anesthetic complications  Airway Mallampati: II  TM Distance: >3 FB Neck ROM: full    Dental no notable dental hx. (+) Teeth Intact   Pulmonary Current Smoker,    Pulmonary exam normal breath sounds clear to auscultation       Cardiovascular hypertension, Normal cardiovascular exam Rhythm:regular Rate:Normal     Neuro/Psych PSYCHIATRIC DISORDERS Anxiety negative neurological ROS     GI/Hepatic negative GI ROS, Neg liver ROS,   Endo/Other  diabetes  Renal/GU negative Renal ROS  negative genitourinary   Musculoskeletal   Abdominal   Peds  Hematology negative hematology ROS (+)   Anesthesia Other Findings   Reproductive/Obstetrics (+) Pregnancy                             Anesthesia Physical Anesthesia Plan  ASA: III  Anesthesia Plan: Epidural   Post-op Pain Management:    Induction:   PONV Risk Score and Plan:   Airway Management Planned:   Additional Equipment:   Intra-op Plan:   Post-operative Plan:   Informed Consent: I have reviewed the patients History and Physical, chart, labs and discussed the procedure including the risks, benefits and alternatives for the proposed anesthesia with the patient or authorized representative who has indicated his/her understanding and acceptance.     Plan Discussed with:   Anesthesia Plan Comments:         Anesthesia Quick Evaluation

## 2017-11-15 NOTE — Progress Notes (Signed)
OB/GYN Faculty Practice: Labor Progress Note  Subjective: No complaints. Feeling her contractions. Small amount of VB, no LOF.   Objective: BP (!) 125/58   Pulse 67   Temp 97.7 F (36.5 C) (Oral)   Resp 20   Ht 5\' 2"  (1.575 m)   Wt 72.7 kg   LMP 02/28/2017   BMI 29.30 kg/m  Gen: no distress Dilation: 4 Effacement (%): 50 Cervical Position: Posterior Station: -2 Presentation: Vertex Exam by:: Jonelle Sidle, RN  Assessment and Plan: 32 y.o. G1P0000 [redacted]w[redacted]d admitted for IOL 2/2 gHTN.   Labor: s/p FB and cytotec x2 -- continue pitocin 4x4 -- pain control: IV fentanyl   Fetal Well-Being: cephalic  -- Category 1 - continuous fetal monitoring  -- GBS (-)  gHTN - some moderate range pressures overnight, none since 0305 - PIH labs were normal   A2GDM - last BG was 88 at 0859 - continue q4hr BG checks, goal <120  Burman Nieves, MD Family Medicine Resident 11:36 AM

## 2017-11-15 NOTE — Progress Notes (Signed)
LABOR PROGRESS NOTE  Caidynce Muzyka is a 32 y.o. G1P0000 at [redacted]w[redacted]d  admitted for IOL due to [facility].   Subjective: Strip Note.   Objective: BP (!) 148/63   Pulse 67   Temp 97.7 F (36.5 C) (Oral)   Resp 19   Ht 5\' 2"  (1.575 m)   Wt 72.7 kg   LMP 02/28/2017   BMI 29.30 kg/m  or  Vitals:   11/14/17 2303 11/15/17 0014 11/15/17 0058 11/15/17 0200  BP: (!) 133/52 132/76 (!) 131/56 (!) 148/63  Pulse: 75 77 77 67  Resp: 16 16 16 19   Temp: 97.8 F (36.6 C)   97.7 F (36.5 C)  TempSrc: Oral   Oral  Weight:      Height:        Dilation: 2 Effacement (%): 50 Cervical Position: Middle Station: -2 Presentation: Vertex Exam by:: Jonelle Sidle, RN FHT: baseline rate 130, moderate varibility, +acel, -decel Toco: Irritable, every 2-5 min   Labs: Lab Results  Component Value Date   WBC 12.7 (H) 11/14/2017   HGB 14.2 11/14/2017   HCT 43.5 11/14/2017   MCV 92.2 11/14/2017   PLT 291 11/14/2017    Patient Active Problem List   Diagnosis Date Noted  . Gestational hypertension 11/14/2017  . Insulin controlled gestational diabetes mellitus (GDM) in third trimester 10/18/2017  . Diabetes mellitus during pregnancy, antepartum 10/11/2017  . Abnormal glucose tolerance test (GTT) during pregnancy, antepartum 09/28/2017  . Tobacco smoking affecting pregnancy, antepartum 06/19/2017  . Supervision of normal first pregnancy, antepartum 05/22/2017    Assessment / Plan: 32 y.o. G1P0000 at [redacted]w[redacted]d here for IOL due to [facility].   Labor: IOL with cytotec and FB still in place. Last cytotec given around 0020.  Fetal Wellbeing:  Cat 1 strip Pain Control:  Iv fent PRN  Anticipated MOD:  NSVD   1. GHTN: Stable, mild-moderate range. Asymptomatic.PIH labs wnl.   -Monitor BP  2. A2GDM: Stable, last glucose 105.   -CBG q4 during latent labor, maintain <120  Leticia Penna, D.O. Family medicine PGY-1  11/15/2017, 2:19 AM

## 2017-11-16 ENCOUNTER — Encounter (HOSPITAL_COMMUNITY): Payer: Self-pay

## 2017-11-16 DIAGNOSIS — O24429 Gestational diabetes mellitus in childbirth, unspecified control: Secondary | ICD-10-CM

## 2017-11-16 DIAGNOSIS — Z3A37 37 weeks gestation of pregnancy: Secondary | ICD-10-CM

## 2017-11-16 DIAGNOSIS — O134 Gestational [pregnancy-induced] hypertension without significant proteinuria, complicating childbirth: Secondary | ICD-10-CM

## 2017-11-16 LAB — CBC
HEMATOCRIT: 39.9 % (ref 36.0–46.0)
HEMOGLOBIN: 13.3 g/dL (ref 12.0–15.0)
MCH: 30.2 pg (ref 26.0–34.0)
MCHC: 33.3 g/dL (ref 30.0–36.0)
MCV: 90.7 fL (ref 80.0–100.0)
Platelets: 226 10*3/uL (ref 150–400)
RBC: 4.4 MIL/uL (ref 3.87–5.11)
RDW: 14.4 % (ref 11.5–15.5)
WBC: 21.3 10*3/uL — ABNORMAL HIGH (ref 4.0–10.5)
nRBC: 0 % (ref 0.0–0.2)

## 2017-11-16 LAB — GLUCOSE, CAPILLARY: Glucose-Capillary: 107 mg/dL — ABNORMAL HIGH (ref 70–99)

## 2017-11-16 MED ORDER — SENNOSIDES-DOCUSATE SODIUM 8.6-50 MG PO TABS
2.0000 | ORAL_TABLET | ORAL | Status: DC
  Administered 2017-11-17: 2 via ORAL
  Filled 2017-11-16: qty 2

## 2017-11-16 MED ORDER — NICOTINE 21 MG/24HR TD PT24
21.0000 mg | MEDICATED_PATCH | Freq: Every day | TRANSDERMAL | Status: DC
  Administered 2017-11-16: 21 mg via TRANSDERMAL
  Filled 2017-11-16 (×2): qty 1

## 2017-11-16 MED ORDER — ZOLPIDEM TARTRATE 5 MG PO TABS: 5.0000 mg | ORAL_TABLET | Freq: Every evening | ORAL | Status: DC | PRN

## 2017-11-16 MED ORDER — SODIUM CHLORIDE 0.9% FLUSH: 3.0000 mL | INTRAVENOUS | Status: DC | PRN

## 2017-11-16 MED ORDER — PRENATAL MULTIVITAMIN CH
1.0000 | ORAL_TABLET | Freq: Every day | ORAL | Status: DC
  Administered 2017-11-16 – 2017-11-17 (×2): 1 via ORAL
  Filled 2017-11-16 (×2): qty 1

## 2017-11-16 MED ORDER — SODIUM CHLORIDE 0.9 % IV SOLN: 250.0000 mL | INTRAVENOUS | Status: DC | PRN

## 2017-11-16 MED ORDER — SODIUM CHLORIDE 0.9% FLUSH: 3.0000 mL | Freq: Two times a day (BID) | INTRAVENOUS | Status: DC

## 2017-11-16 MED ORDER — MEASLES, MUMPS & RUBELLA VAC IJ SOLR
0.5000 mL | Freq: Once | INTRAMUSCULAR | Status: DC
  Filled 2017-11-16: qty 0.5

## 2017-11-16 MED ORDER — IBUPROFEN 600 MG PO TABS
600.0000 mg | ORAL_TABLET | Freq: Four times a day (QID) | ORAL | Status: DC
  Administered 2017-11-16 – 2017-11-17 (×6): 600 mg via ORAL
  Filled 2017-11-16 (×6): qty 1

## 2017-11-16 MED ORDER — COCONUT OIL OIL
1.0000 "application " | TOPICAL_OIL | Status: DC | PRN
  Administered 2017-11-17: 1 via TOPICAL
  Filled 2017-11-16: qty 120

## 2017-11-16 MED ORDER — TETANUS-DIPHTH-ACELL PERTUSSIS 5-2.5-18.5 LF-MCG/0.5 IM SUSP: 0.5000 mL | Freq: Once | INTRAMUSCULAR | Status: DC

## 2017-11-16 MED ORDER — ONDANSETRON HCL 4 MG/2ML IJ SOLN: 4.0000 mg | INTRAMUSCULAR | Status: DC | PRN

## 2017-11-16 MED ORDER — WITCH HAZEL-GLYCERIN EX PADS: 1.0000 "application " | MEDICATED_PAD | CUTANEOUS | Status: DC | PRN

## 2017-11-16 MED ORDER — SIMETHICONE 80 MG PO CHEW
80.0000 mg | CHEWABLE_TABLET | ORAL | Status: DC | PRN
  Administered 2017-11-17: 80 mg via ORAL

## 2017-11-16 MED ORDER — DIPHENHYDRAMINE HCL 25 MG PO CAPS: 25.0000 mg | ORAL_CAPSULE | Freq: Four times a day (QID) | ORAL | Status: DC | PRN

## 2017-11-16 MED ORDER — ONDANSETRON HCL 4 MG PO TABS: 4.0000 mg | ORAL_TABLET | ORAL | Status: DC | PRN

## 2017-11-16 MED ORDER — NIFEDIPINE ER OSMOTIC RELEASE 30 MG PO TB24
30.0000 mg | ORAL_TABLET | Freq: Every day | ORAL | Status: DC
  Administered 2017-11-17: 30 mg via ORAL
  Filled 2017-11-16: qty 1

## 2017-11-16 MED ORDER — DIBUCAINE 1 % RE OINT: 1.0000 "application " | TOPICAL_OINTMENT | RECTAL | Status: DC | PRN

## 2017-11-16 MED ORDER — BENZOCAINE-MENTHOL 20-0.5 % EX AERO
1.0000 "application " | INHALATION_SPRAY | CUTANEOUS | Status: DC | PRN
  Filled 2017-11-16: qty 56

## 2017-11-16 MED ORDER — ACETAMINOPHEN 325 MG PO TABS: 650.0000 mg | ORAL_TABLET | ORAL | Status: DC | PRN

## 2017-11-16 NOTE — Progress Notes (Addendum)
POSTPARTUM PROGRESS NOTE  Post Partum Day 1  Subjective:  Cassandra Marsh is a 32 y.o. G1P1001 s/p SVD at [redacted]w[redacted]d.  She reports she is doing well. No acute events overnight. She denies any problems with ambulating or voiding. No BM or flatus. Has not eaten anything since delivery around midnight last night, no problems with fluid intake. Denies nausea or vomiting.  Pain is well controlled.  Lochia is moderate.  Objective: Blood pressure 136/77, pulse 80, temperature 97.9 F (36.6 C), resp. rate 16, height 5\' 2"  (1.575 m), weight 72.7 kg, last menstrual period 02/28/2017, SpO2 99 %, unknown if currently breastfeeding.  Physical Exam:  General: alert, cooperative and no distress Chest: no respiratory distress Heart:regular rate, distal pulses intact Abdomen: soft, nontender,  Uterine Fundus: firm, appropriately tender DVT Evaluation: No calf swelling or tenderness Extremities: no edema Skin: warm, dry  Recent Labs    11/15/17 1807 11/16/17 0122  HGB 14.5 13.3  HCT 44.2 39.9    Assessment/Plan: Cassandra Marsh is a 32 y.o. G1P1001 s/p SVD at [redacted]w[redacted]d   PPD#1 - Doing well  Routine postpartum care  Contraception: Considering IUD  Feeding: Breastfeeding  Dispo: Plan for discharge tomorrow.   1. A2GDM: On 18 units SA insulin total during pregnancy. Nonfasting glucose this am 107. Will monitor.   2. GHTN: Mild range BPs immediately following delivery, BP 136/77 this am.   -Monitor BP, consideration for treatment prior to d/c    3. THC use during pregnancy/smoker/alcohol use: SW consult placed, pending.    LOS: 2 days   Leticia Penna, D.O. Family Medicine PGY-1  11/16/2017, 7:21 AM   OB FELLOW POSTPARTUM PROGRESS NOTE ATTESTATION  I have seen and examined this patient and agree with above documentation in the resident's note.   Gwenevere Abbot, MD OB Fellow  11/16/2017, 8:14 AM

## 2017-11-16 NOTE — Anesthesia Postprocedure Evaluation (Signed)
Anesthesia Post Note  Patient: Cassandra Marsh  Procedure(s) Performed: AN AD HOC LABOR EPIDURAL     Patient location during evaluation: Mother Baby Anesthesia Type: Epidural Level of consciousness: awake and alert Pain management: pain level controlled Vital Signs Assessment: post-procedure vital signs reviewed and stable Respiratory status: spontaneous breathing, nonlabored ventilation and respiratory function stable Cardiovascular status: stable Postop Assessment: no headache, no backache, epidural receding, able to ambulate, adequate PO intake, no apparent nausea or vomiting and patient able to bend at knees Anesthetic complications: no    Last Vitals:  Vitals:   11/16/17 0230 11/16/17 0651  BP: (!) 137/91 136/77  Pulse: 73 80  Resp: 18 16  Temp: 36.8 C 36.6 C  SpO2: 97% 99%    Last Pain:  Vitals:   11/16/17 0651  TempSrc:   PainSc: 0-No pain   Pain Goal:                 [place]

## 2017-11-16 NOTE — Lactation Note (Signed)
This note was copied from a baby's chart. Lactation Consultation Note  Patient Name: Cassandra Marsh [id number] Date: 11/16/2017 Reason for consult: Initial assessment;Primapara;1st time breastfeeding;Infant < 6lbs;Early term 37-38.6wks;Maternal endocrine disorder Type of Endocrine Disorder?: Diabetes(GDM on insulin)  Visited with P1 Mom of [redacted]w[redacted]d baby at 8 hrs old.  Baby's <6 lbs.  LPTI guidelines shared with Mom.  Mom resting and baby sleeping in crib.  Mom has hand expressed and fed baby 5-7 ml of colostrum by spoon and curved tip syringe, for 5 feedings already.  Mom knows to offer breast first.  Pecola Leisure has been sleepy today, reassured Mom that this was normal.  Plan- 1- Keep baby STS as much as possible 2- Awaken baby for feedings at least every 3 hrs, sooner if showing feeding cues. 3- Breastfeed baby with a deep latch.  If baby sleepy, to supplement with 5-10 ml (first 24 hrs), increasing to 10-20 ml after 24 hrs old.  Colostrum +/formula as needed. 4- Pump both breasts on initiation setting  5- ask for help prn.  Mom pumped one breast at a time first pumping and obtained 20 ml of colostrum.  Recommended she split this into 2 feedings.  Encouraged double pumping to increase prolactin level higher.  Reviewed importance of disassembling pump parts and washing and rinsing.  Washed pump parts for Mom and set them in a separate basin to dry.   Encouraged Mom to call to get help with latching prn.  Lactation brochure left in room.  Mom aware of LC support IP and OP.   Faxed referral to [facility].  Mom aware of [facility] loaner available on discharge.   Consult Status Consult Status: Follow-up Date: 11/17/17 Follow-up type: In-patient    Cassandra Marsh 11/16/2017, 1:49 PM

## 2017-11-16 NOTE — Clinical Social Work Maternal (Signed)
CLINICAL SOCIAL WORK MATERNAL/CHILD NOTE  Patient Details  Name: Cassandra Marsh MRN: [id number] Date of Birth: [birth year]  Date:  11/16/2017  Clinical Social Worker Initiating Note:  Laurey Arrow Date/Time: Initiated:  11/16/17/1014     Child's Name:  Cassandra Marsh   Biological Parents:  Mother, Father(FOB is Boone Master 06/11/1959)   Need for Interpreter:  None   Reason for Referral:  Behavioral Health Concerns, Current Substance Use/Substance Use During Pregnancy (hx of anxiety/depression and marijuana use. )   Address:  [address] [place] [address]    Phone number:  [phone] (home)     Additional phone number:  Household Members/Support Persons (HM/SP):   (Per MOB, MOB lives alone. )   HM/SP Name Relationship DOB or Age  HM/SP -1        HM/SP -2        HM/SP -3        HM/SP -4        HM/SP -5        HM/SP -6        HM/SP -7        HM/SP -8          Natural Supports (not living in the home):  Parent, Immediate Family, Extended Family, Spouse/significant other   Professional Supports: None   Employment: Self-employed   Type of Work:     Education:  9 to 11 years(However, MOB obtained her GED.)   Homebound arranged: No  Financial Resources:  [employer]   Other Resources:      Cultural/Religious Considerations Which May Impact Care:  None reported  Strengths:  Ability to meet basic needs , Engineer, materials, Home prepared for child    Psychotropic Medications:         Pediatrician:    [place] (including [place] engineer and surronding areas)  Pediatrician List:   [place] Other([place] and [place] )  [facility]      Pediatrician Fax Number:    Risk Factors/Current Problems:  Substance Use    Cognitive State:  Able to Concentrate , Alert , Goal Oriented , Insightful , Linear Thinking    Mood/Affect:  Interested , Relaxed , Happy ,  Bright    CSW Assessment: CSW met with MOB in MOB's  Room 108 to offer support and complete assessment due to hx of Anxiety/depression and marijuana use.  MOB was quiet, but pleasant.  She reports that she and FOB are together, and reported, "Being with him is the happiest I have been in a ling time. This is MOB's first baby, and by her facial expression it is evident that she is excited.    MOB states that she and baby are doing well.  She reports that she has everything needed for baby and a good support system.  She is aware of SIDS precautions.  She admitted to hx of anxiety and communicated that it was situation due other a previous toxic relationship. and was attentive to information and resources given regarding PMADs.  She states no questions, concerns or needs at this time. MOB presented with insight and awareness and denied SI, HI, and DV.   CSW inquired about marijuana use.  She states she smoked marijuana throughout her pregnancy to decrease her nausea and to increase her appetite.  MOB openly shared that MOB notified her OB provider of her substance use.  CSW  made MOB aware of hospital's drug screen policy and MOB denied having any questions. CSW will monitor infant's UDS and  CDS and will make a report to [place] if warranted.  CSW Plan/Description:  No Further Intervention Required/No Barriers to Discharge, Sudden Infant Death Syndrome (SIDS) Education, Perinatal Mood and Anxiety Disorder (PMADs) Education, Other Patient/Family Education, [place], CSW Will Continue to Monitor Umbilical Cord Tissue Drug Screen Results and Make Report if Warranted, Other Information/Referral to [place], MSW, [facility] Clinical Social Work [phone]   Dimple Nanas, LCSW 11/16/2017, 12:21 PM

## 2017-11-17 ENCOUNTER — Ambulatory Visit: Payer: Self-pay

## 2017-11-17 MED ORDER — SENNOSIDES-DOCUSATE SODIUM 8.6-50 MG PO TABS: 2.0000 | ORAL_TABLET | ORAL | 0 refills | Status: DC

## 2017-11-17 MED ORDER — IBUPROFEN 600 MG PO TABS: 600.0000 mg | ORAL_TABLET | Freq: Four times a day (QID) | ORAL | 0 refills | Status: DC

## 2017-11-17 MED ORDER — NIFEDIPINE ER 30 MG PO TB24: 30.0000 mg | ORAL_TABLET | Freq: Every day | ORAL | 0 refills | Status: DC

## 2017-11-17 NOTE — Progress Notes (Addendum)
CSW received consult due to score 10 on Edinburgh Depression Screen.    MOB was assessed by CSW (A.Boyd) on 11/16/17 and provided education regarding Baby Blues vs PMADs and provided MOB with resources for mental health follow up.    CSW signing off, MOB was assessed on 11/16/17 and provided with (PMADs) Education.   No barriers to discharge.  Celso Sickle, LCSWA Clinical Social Worker [facility] Cell#: [phone]

## 2017-11-17 NOTE — Lactation Note (Signed)
This note was copied from a baby's chart. Lactation Consultation Note  Patient Name: Cassandra Marsh Date: 11/17/2017 Reason for consult: Initial assessment;1st time breastfeeding;Early term 37-38.6wks;Infant < 6lbs P1, 26 hour female infant , ETI, weight loss  only -1% at this timw. BF concerns: ETI, high billi level, mom with short shaft nipple and infant not latching to breast mom has been pumping and hand expressing. Dad informed LC he doesn't want infant to be  supplemented with formula at this time. Per mom, as LC entered room she gave infant 10 ml of EBM by curve tip syringe.  LC assisted mom in latching infant to breast, infant suckle a few times but did not sustain latch. Mouth was wide gape with chin down. Per mom, she only used DEBP 4 times yesterday. Mom's plan: 1. Will ask for assistance with latching infant to breast with next feeding. 2. Mom will use DEBP 8 times within 24 hours. 3. Mom will BF, give back EBM and then think about supplementing with formula.        Mom states infant not latched to breast has been giving infant EBM by curve tip syringe.   Maternal Data Formula Feeding for Exclusion: No Has patient been taught Hand Expression?: No Does the patient have breastfeeding experience prior to this delivery?: No  Feeding    LATCH Score Latch: Repeated attempts needed to sustain latch, nipple held in mouth throughout feeding, stimulation needed to elicit sucking reflex.  Audible Swallowing: A few with stimulation  Type of Nipple: Everted at rest and after stimulation(mom little short shafted )  Comfort (Breast/Nipple): Soft / non-tender  Hold (Positioning): Assistance needed to correctly position infant at breast and maintain latch.  LATCH Score: 7  Interventions Interventions: Assisted with latch;Breast massage;Adjust position;Hand express;Position options;Support pillows  Lactation Tools Discussed/Used     Consult Status Consult  Status: Follow-up Date: 11/17/17 Follow-up type: In-patient    Danelle Earthly 11/17/2017, 2:08 AM

## 2017-11-17 NOTE — Lactation Note (Signed)
This note was copied from a baby's chart. Lactation Consultation Note  Patient Name: Cassandra Marsh [id number] Date: 11/17/2017 Reason for consult: Follow-up assessment;Early term 37-38.6wks P1, 28 hours female infant LC entered room infant cuing to BF Mom latched infant on left breast in cross-cradle hold, infant had wide mouth gape and was gulping and swallows heard, infant in rthymitic sucking pattern. Infant BF 15 minutes as LC left room I(nfant  Is still breastfeeding). Mom is feeling more confident with her reastfeeding abilities now.  Parents do not plan to supplement with formula at this time. wait for serum billi test  ( TSB) results.  Mom will continue use DEBP and give back EBM.   Maternal Data Formula Feeding for Exclusion: No Has patient been taught Hand Expression?: No Does the patient have breastfeeding experience prior to this delivery?: No  Feeding Feeding Type: Breast Milk  LATCH Score Latch: Grasps breast easily, tongue down, lips flanged, rhythmical sucking.  Audible Swallowing: Spontaneous and intermittent  Type of Nipple: Everted at rest and after stimulation  Comfort (Breast/Nipple): Soft / non-tender  Hold (Positioning): Assistance needed to correctly position infant at breast and maintain latch.  LATCH Score: 9  Interventions Interventions: Support pillows;Adjust position;Breast compression  Lactation Tools Discussed/Used     Consult Status Consult Status: Follow-up Date: 11/18/17 Follow-up type: In-patient    Danelle Earthly 11/17/2017, 3:56 AM

## 2017-11-17 NOTE — Lactation Note (Signed)
This note was copied from a baby's chart. Lactation Consultation Note  Patient Name: Cassandra Marsh [id number] Date: 11/17/2017 Reason for consult: Follow-up assessment;Early term 37-38.6wks;Infant weight loss;Maternal endocrine disorder;Infant < 6lbs Type of Endocrine Disorder?: Diabetes  72 hours old FT female who is being exclusively BF by his mother. Mom has been pumping about 25 ml of EBM, she pumped at 3:30 pm the last time, and told LC she's trying to do it every 3 hours. Baby was already nursing when entering the room, mom required minimal assistance to reposition baby, he's an early term < 6 lbs and now at 8% weight loss. A few audible swallows were heard, mom already had some colostrum in the curved tip syringe to give to baby. Mom doing feedings STS, but baby not wearing his hat.  She asked LC for assistance on how to convert her DEBP kit into a hand pump to help evert her nipples, they're short shafted but do not interfere with baby's ability to feed at the breast. Mom will try hand pumping prior feedings to help evert her nipples.  Feeding Plan  1. Encouraged mom to keep feeding baby 8-12 times/24 hours or sooner if feeding cues are present 2. She'll pump after feedings every 3 hours, at least 8 times/24 hours 3. Parents will syringe feed baby at least 18-25 ml of breastmilk per feeding prior taking him to the breast when baby shows feeding cues 4. Mom will limit feedings at the breast to 30 minutes at a time 5. If mom is not able to keep with required amounts for supplementation, parents will consider supplementing with formula, but will wait for results on bilirubin screening on the AM.  Parents reported all questions and concerns were answered, they're both aware of [facility] services and will call PRN.  Maternal Data    Feeding Feeding Type: Breast Fed  LATCH Score Latch: Grasps breast easily, tongue down, lips flanged, rhythmical sucking.  Audible Swallowing: A few with  stimulation(LC walked in the room at the end of the feeding)  Type of Nipple: Everted at rest and after stimulation(short shafted)  Comfort (Breast/Nipple): Soft / non-tender  Hold (Positioning): No assistance needed to correctly position infant at breast.  LATCH Score: 9  Interventions Interventions: Breast feeding basics reviewed;Assisted with latch;Skin to skin;Breast massage;Breast compression;Adjust position;Hand pump;Support pillows  Lactation Tools Discussed/Used Tools: Pump Breast pump type: Manual(mother requested assistance on how to convert her DEBP into a hand pump)   Consult Status Consult Status: Follow-up Date: 11/18/17 Follow-up type: In-patient    Karenann Mcgrory Francene Boyers 11/17/2017, 7:21 PM

## 2017-11-17 NOTE — Discharge Summary (Addendum)
OB Discharge Summary  Patient Name: Cassandra Marsh DOB: [birth year] MRN: [id number]  Date of admission: 11/14/2017 Delivering MD: Allayne Stack   Date of discharge: 11/17/2017  Admitting diagnosis: LABOR Intrauterine pregnancy: [redacted]w[redacted]d     Secondary diagnosis:  Principal Problem:   Gestational hypertension Active Problems:   Supervision of normal first pregnancy, antepartum   Tobacco smoking affecting pregnancy, antepartum   Diabetes mellitus during pregnancy, antepartum   Insulin controlled gestational diabetes mellitus (GDM) in third trimester  Additional problems: THC use and alcohol use during pregnancy     Discharge diagnosis: Term Pregnancy Delivered and GDM A2                                                                                                Post partum procedures:None  Augmentation: AROM, Pitocin, Cytotec and Foley Balloon  Complications: None  Hospital course:  Induction of Labor With Vaginal Delivery   32 y.o. yo G1P1001 at [redacted]w[redacted]d was admitted to the hospital 11/14/2017 for induction of labor.  Indication for induction: Gestational hypertension and A2 DM.  Patient had an uncomplicated labor course as follows: Membrane Rupture Time/Date: 2:41 PM ,11/15/2017   Intrapartum Procedures: Episiotomy: None [1]                                         Lacerations:  None [1]  Patient had delivery of a Viable infant.  Information for the patient's newborn:  Lucine, Bilski [[id number]]  Delivery Method: Vag-Spont   11/15/2017  Details of delivery can be found in separate delivery note.  Patient had a routine postpartum course. She continued to have mild-moderate elevated blood pressures following delivery, started on Procardia XL on PPD#2 with normalizing pressures, she was discharged home with this medication. Patient is discharged home 11/17/17.  Physical exam  Vitals:   11/16/17 1506 11/16/17 2115 11/17/17 0526 11/17/17 0724  BP: (!) 147/78 (!) 141/81 (!) 159/89  125/71  Pulse: 83 73 66 70  Resp: 18 18 18    Temp: 97.7 F (36.5 C) 98 F (36.7 C) 98.2 F (36.8 C)   TempSrc: Oral Oral Oral   SpO2: 99% 100% 100%   Weight:      Height:       General: alert, cooperative and no distress Lochia: appropriate Uterine Fundus: firm Incision: N/A DVT Evaluation: No evidence of DVT seen on physical exam. No significant calf/ankle edema. Labs: Lab Results  Component Value Date   WBC 21.3 (H) 11/16/2017   HGB 13.3 11/16/2017   HCT 39.9 11/16/2017   MCV 90.7 11/16/2017   PLT 226 11/16/2017   CMP Latest Ref Rng & Units 11/14/2017  Glucose 70 - 99 mg/dL 629(B)  BUN 6 - 20 mg/dL 9  Creatinine 2.84 - 1.32 mg/dL 4.40  Sodium 102 - 725 mmol/L 136  Potassium 3.5 - 5.1 mmol/L 4.3  Chloride 98 - 111 mmol/L 104  CO2 22 - 32 mmol/L 20(L)  Calcium 8.9 - 10.3 mg/dL 9.6  Total Protein 6.5 -  8.1 g/dL 6.3(L)  Total Bilirubin 0.3 - 1.2 mg/dL 0.5  Alkaline Phos 38 - 126 U/L 166(H)  AST 15 - 41 U/L 19  ALT 0 - 44 U/L 12    Discharge instruction: per After Visit Summary and "Baby and Me Booklet".  After visit meds:  Allergies as of 11/17/2017   No Known Allergies     Medication List    STOP taking these medications   ACCU-CHEK FASTCLIX LANCETS Misc   glucose blood test strip   insulin lispro 100 UNIT/ML injection Commonly known as:  HUMALOG   insulin NPH Human 100 UNIT/ML injection Commonly known as:  HUMULIN N,NOVOLIN N   Insulin Syringe-Needle U-100 25G X 1" 1 ML Misc   promethazine 12.5 MG tablet Commonly known as:  PHENERGAN     TAKE these medications   acetaminophen 500 MG tablet Commonly known as:  TYLENOL Take 1,000 mg by mouth daily as needed for mild pain or headache.   diphenhydrAMINE 25 MG tablet Commonly known as:  BENADRYL Take 50 mg by mouth at bedtime as needed for sleep.   ibuprofen 600 MG tablet Commonly known as:  ADVIL,MOTRIN Take 1 tablet (600 mg total) by mouth every 6 (six) hours.   NIFEdipine 30 MG 24 hr  tablet Commonly known as:  ADALAT CC Take 1 tablet (30 mg total) by mouth daily.   senna-docusate 8.6-50 MG tablet Commonly known as:  Senokot-S Take 2 tablets by mouth daily. Start taking on:  11/18/2017   VITAFOL ULTRA 29-0.6-0.4-200 MG Caps Take 1 capsule by mouth daily.       Diet: routine diet  Activity: Advance as tolerated. Pelvic rest for 6 weeks.   Outpatient follow up:4 weeks and 1 week for a blood pressure check, will need glucose evaluation on PP visit.  Follow up Appt:No future appointments. Follow up Visit:No follow-ups on file.  Postpartum contraception: IUD Mirena  Newborn Data: Live born female  Birth Weight: 5 lb 14.7 oz (2685 g) APGAR: 8, 9  Newborn Delivery   Birth date/time:  11/15/2017 23:19:00 Delivery type:  Vaginal, Spontaneous     Baby Feeding: Breast Disposition: Likely home with mother.   Jerral Ralph, DO  Attestation: I have seen this patient and agree with the resident's documentation. I have examined them separately, and we have discussed the plan of care.  Cristal Deer. Earlene Plater, DO OB/GYN Fellow

## 2017-11-18 ENCOUNTER — Ambulatory Visit: Payer: Self-pay

## 2017-11-18 NOTE — Lactation Note (Signed)
This note was copied from a baby's chart. Lactation Consultation Note  Patient Name: Cassandra Marsh [id number] Date: 11/18/2017 Reason for consult: Follow-up assessment;Early term 37-38.6wks;1st time breastfeeding;Primapara;Infant < 6lbs Type of Endocrine Disorder?: Diabetes  P1 mother whose infant is now 82 hours old.  This is a LPTI at 37+1 weeks weighing < 6 lbs  Mother was pumping with the DEBP when I entered.  Mother has been feeding 8-12 times/24 hours and has been post pumping for 15 minutes after breast feeding and supplementing baby with EBM.  She has been able to meet the minimum volumes for baby with pumping and has not had to give any formula at all.    Encouraged to continue feeding and supplementing at least 8-12 times/24 hours.  She will awaken baby at 3 hours if he remains sleeping.  She will also limit feedings to 30 minutes.  At this pumping session she obtained 25 mls and is going to feed this back now.  She has no questions/concerns related to feeding.    Baby does have a short lingual frenulum but mother stated that he latches well and it does not hurt while he feeds.  Her breasts and nipples are soft and non tender and nipples are short shafted but with no trauma noted.    Engorgement prevention/treatment discussed.  Mother will contact WIC in the a.m. For a pump and has a manual pump for home use.  She will also continue to do hand expression and feed back any volume she obtains to baby.  Mother has our OP phone number for questions after discharge.  Father present.   Maternal Data Formula Feeding for Exclusion: No Has patient been taught Hand Expression?: Yes Does the patient have breastfeeding experience prior to this delivery?: No  Feeding    LATCH Score                   Interventions    Lactation Tools Discussed/Used WIC Program: Yes Initiated by:: Already initiated   Consult Status Consult Status: Complete Date: 11/18/17 Follow-up  type: Call as needed    Cassandra Marsh R [name] 11/18/2017, 12:02 PM

## 2017-12-31 ENCOUNTER — Ambulatory Visit: Payer: Medicaid Other | Admitting: Obstetrics and Gynecology

## 2017-12-31 ENCOUNTER — Other Ambulatory Visit: Payer: Medicaid Other

## 2018-01-08 ENCOUNTER — Telehealth: Payer: Self-pay | Admitting: Obstetrics and Gynecology

## 2018-01-11 ENCOUNTER — Encounter: Payer: Self-pay | Admitting: Obstetrics and Gynecology

## 2018-01-29 ENCOUNTER — Encounter: Payer: Self-pay | Admitting: Advanced Practice Midwife

## 2018-01-29 ENCOUNTER — Ambulatory Visit (INDEPENDENT_AMBULATORY_CARE_PROVIDER_SITE_OTHER): Payer: Medicaid Other | Admitting: Advanced Practice Midwife

## 2018-01-29 VITALS — BP 126/82 | HR 81 | Wt 130.0 lb

## 2018-01-29 DIAGNOSIS — O24414 Gestational diabetes mellitus in pregnancy, insulin controlled: Secondary | ICD-10-CM

## 2018-01-29 DIAGNOSIS — Z30430 Encounter for insertion of intrauterine contraceptive device: Secondary | ICD-10-CM

## 2018-01-29 DIAGNOSIS — Z3202 Encounter for pregnancy test, result negative: Secondary | ICD-10-CM | POA: Diagnosis not present

## 2018-01-29 DIAGNOSIS — Z309 Encounter for contraceptive management, unspecified: Secondary | ICD-10-CM

## 2018-01-29 LAB — POCT URINE PREGNANCY: Preg Test, Ur: NEGATIVE

## 2018-01-29 MED ORDER — LEVONORGESTREL 19.5 MCG/DAY IU IUD
INTRAUTERINE_SYSTEM | Freq: Once | INTRAUTERINE | Status: AC
  Administered 2018-01-29: 16:00:00 via INTRAUTERINE

## 2018-01-29 NOTE — Patient Instructions (Signed)

## 2018-01-29 NOTE — Progress Notes (Signed)
Post Partum Exam  Cassandra Marsh is a 33 y.o. G24P1001 female who presents for a postpartum visit. She is 10 weeks postpartum following a spontaneous vaginal delivery. I have fully reviewed the prenatal and intrapartum course. The delivery was at 37 gestational weeks.  Anesthesia: epidural. Postpartum course has been well. Baby's course has been well. Baby is feeding by bottle - Lucien Mons Start . Bleeding no bleeding. Bowel function is normal. Bladder function is normal. Patient is sexually active. Contraception method is condoms. Postpartum depression screening:neg UPT : NEGATIVE TODAY   The following portions of the patient's history were reviewed and updated as appropriate: allergies, current medications, past family history, past medical history, past social history, past surgical history and problem list. Last pap smear done 05/22/2017 and was Normal  Review of Systems Pertinent items noted in HPI and remainder of comprehensive ROS otherwise negative.    Objective:  Last menstrual period 02/28/2017, unknown if currently breastfeeding.  BP 126/82   Pulse 81   Wt 59 kg   LMP 02/28/2017   Breastfeeding Yes Comment: per pt had bleeding for 8wks after delivery no period since.   BMI 23.78 kg/m   VS reviewed, nursing note reviewed,  Constitutional: well developed, well nourished, no distress HEENT: normocephalic CV: normal rate Pulm/chest wall: normal effort Breast Exam:  Deferred Abdomen: soft Neuro: alert and oriented x 3 Skin: warm, dry Psych: affect normal Pelvic exam: Cervix pink, visually closed, without lesion, scant white creamy discharge, vaginal walls and external genitalia normal  IUD Procedure Note Pt pregnancy test is negative.  Patient identified, informed consent performed.  Discussed risks of irregular bleeding, cramping, infection, malpositioning or misplacement of the IUD outside the uterus which may require further procedures. Time out was performed.  Urine  pregnancy test negative.  Speculum placed in the vagina.  Cervix visualized.  Cleaned with Betadine x 2.  Grasped anteriorly with a single tooth tenaculum.  Uterus sounded to 7 cm.  Liletta IUD placed per manufacturer's recommendations.  Strings trimmed to 4 cm. Tenaculum was removed, good hemostasis noted.  Patient tolerated procedure well.   Patient was given post-procedure instructions and the Liletta care card with expiration date.  Patient was also asked to check IUD strings periodically and follow up in 4-6 weeks for IUD check.       Assessment/Plan:   1. Insulin controlled gestational diabetes mellitus (GDM) in third trimester --Needs 2 hour GTT, will do at string check visit.  2. Encounter for contraceptive management, unspecified type --Discussed contraceptive choices, pt is interested in IUD today.   - POCT urine pregnancy  3. Encounter for IUD insertion --Pt tolerated well, see procedure note above.  - Levonorgestrel (LILETTA) 19.5 MCG/DAY IUD  Sharen Counter, CNM 4:18 PM

## 2018-02-14 ENCOUNTER — Encounter: Payer: Self-pay | Admitting: Advanced Practice Midwife

## 2018-02-26 ENCOUNTER — Other Ambulatory Visit: Payer: Medicaid Other

## 2018-02-26 ENCOUNTER — Ambulatory Visit (INDEPENDENT_AMBULATORY_CARE_PROVIDER_SITE_OTHER): Payer: Medicaid Other | Admitting: Advanced Practice Midwife

## 2018-02-26 ENCOUNTER — Ambulatory Visit: Payer: Medicaid Other | Admitting: Advanced Practice Midwife

## 2018-02-26 VITALS — BP 134/85 | HR 93 | Wt 131.0 lb

## 2018-02-26 DIAGNOSIS — Z975 Presence of (intrauterine) contraceptive device: Secondary | ICD-10-CM | POA: Insufficient documentation

## 2018-02-26 DIAGNOSIS — O99814 Abnormal glucose complicating childbirth: Secondary | ICD-10-CM

## 2018-02-26 DIAGNOSIS — R102 Pelvic and perineal pain: Secondary | ICD-10-CM

## 2018-02-26 DIAGNOSIS — O99810 Abnormal glucose complicating pregnancy: Secondary | ICD-10-CM

## 2018-02-26 DIAGNOSIS — Z30431 Encounter for routine checking of intrauterine contraceptive device: Secondary | ICD-10-CM | POA: Diagnosis not present

## 2018-02-26 DIAGNOSIS — T839XXA Unspecified complication of genitourinary prosthetic device, implant and graft, initial encounter: Secondary | ICD-10-CM

## 2018-02-26 HISTORY — DX: Presence of (intrauterine) contraceptive device: Z97.5

## 2018-02-26 NOTE — Progress Notes (Signed)
  GYNECOLOGY CLINIC PROGRESS NOTE  History:  33 y.o. G1P1001 here today for today for IUD string check; Mirena IUD was placed  01/29/18. Some cramping and irregular bleeding with IUD, pt happy overall.    The following portions of the patient's history were reviewed and updated as appropriate: allergies, current medications, past family history, past medical history, past social history, past surgical history and problem list. Last pap smear on 05/22/17 was normal (absent transformation zone), neg HRHPV.  Review of Systems:  Pertinent items are noted in HPI.   Objective:  Physical Exam Blood pressure 134/85, pulse 93, weight 59.4 kg, currently breastfeeding. Gen: NAD Abd: Soft, nontender and nondistended Pelvic: Normal appearing external genitalia; normal appearing vaginal mucosa and cervix.  IUD strings visualized, about 4 cm in length outside cervix.   Assessment & Plan:  Normal IUD check. 1. Encounter for routine checking of intrauterine contraceptive device (IUD) --IUD string visible, wnl, on exam. --No abnormal discharge, minimal pelvic pain with exam  2. Abnormal glucose tolerance test (GTT) during pregnancy, delivered  - Glucose tolerance, 2 hours  3. Acute pelvic pain, female --Intermittent cramping pain associated with light bleeding. At times, the pain is severe per the pt, but is managed by heat/Tylenol/ibuprofen. --No pain with intercourse --Pain is likely menstrual, wnl with new IUD but will evaluate IUD position with [place]. - US PELVIC COMPLETE WITH TRANSVAGINAL; Future  4. Complication of intrauterine device (IUD), unspecified complication, initial encounter (HCC)  - US PELVIC COMPLETE WITH TRANSVAGINAL; Future   Patient to keep IUD in place for five years; can come in for removal if she desires pregnancy within the next five years. Routine preventative health maintenance measures emphasized.  Sharen Counter, CNM 9:24 AM

## 2018-02-26 NOTE — Patient Instructions (Signed)

## 2018-02-27 LAB — GLUCOSE TOLERANCE, 2 HOURS
GLUCOSE, 2 HOUR: 65 mg/dL (ref 65–139)
Glucose, GTT - Fasting: 100 mg/dL — ABNORMAL HIGH (ref 65–99)

## 2018-03-06 ENCOUNTER — Encounter: Payer: Self-pay | Admitting: Advanced Practice Midwife

## 2018-03-06 ENCOUNTER — Ambulatory Visit (HOSPITAL_COMMUNITY): Payer: Medicaid Other

## 2018-05-20 ENCOUNTER — Emergency Department (HOSPITAL_COMMUNITY)
Admission: EM | Admit: 2018-05-20 | Discharge: 2018-05-20 | Disposition: A | Discharge status: Home / Self Care | Payer: Medicaid Other | Attending: Emergency Medicine | Admitting: Emergency Medicine

## 2018-05-20 ENCOUNTER — Emergency Department (HOSPITAL_COMMUNITY): Payer: Medicaid Other

## 2018-05-20 ENCOUNTER — Other Ambulatory Visit: Payer: Self-pay

## 2018-05-20 ENCOUNTER — Encounter (HOSPITAL_COMMUNITY): Payer: Self-pay

## 2018-05-20 DIAGNOSIS — W1842XA Slipping, tripping and stumbling without falling due to stepping into hole or opening, initial encounter: Secondary | ICD-10-CM | POA: Insufficient documentation

## 2018-05-20 DIAGNOSIS — I10 Essential (primary) hypertension: Secondary | ICD-10-CM | POA: Diagnosis not present

## 2018-05-20 DIAGNOSIS — Y999 Unspecified external cause status: Secondary | ICD-10-CM | POA: Insufficient documentation

## 2018-05-20 DIAGNOSIS — Z79899 Other long term (current) drug therapy: Secondary | ICD-10-CM | POA: Insufficient documentation

## 2018-05-20 DIAGNOSIS — F17210 Nicotine dependence, cigarettes, uncomplicated: Secondary | ICD-10-CM | POA: Insufficient documentation

## 2018-05-20 DIAGNOSIS — Y929 Unspecified place or not applicable: Secondary | ICD-10-CM | POA: Diagnosis not present

## 2018-05-20 DIAGNOSIS — Y939 Activity, unspecified: Secondary | ICD-10-CM | POA: Insufficient documentation

## 2018-05-20 DIAGNOSIS — M25572 Pain in left ankle and joints of left foot: Secondary | ICD-10-CM | POA: Insufficient documentation

## 2018-05-20 DIAGNOSIS — S99912A Unspecified injury of left ankle, initial encounter: Secondary | ICD-10-CM | POA: Diagnosis present

## 2018-05-20 DIAGNOSIS — E119 Type 2 diabetes mellitus without complications: Secondary | ICD-10-CM | POA: Insufficient documentation

## 2018-05-20 DIAGNOSIS — W19XXXA Unspecified fall, initial encounter: Secondary | ICD-10-CM

## 2018-05-20 MED ORDER — OXYCODONE-ACETAMINOPHEN 5-325 MG PO TABS
1.0000 | ORAL_TABLET | Freq: Once | ORAL | Status: AC
  Administered 2018-05-20: 1 via ORAL
  Filled 2018-05-20: qty 1

## 2018-05-20 NOTE — ED Triage Notes (Signed)
Per EMS, Pt was walking in her yard, pt stepped in a post hole and fell. Possible deformity in left leg.

## 2018-05-20 NOTE — ED Notes (Signed)
Bed: YO37 Expected date:  Expected time:  Means of arrival:  Comments: 33 yo F/left ankle injury w/deformity

## 2018-05-20 NOTE — Discharge Instructions (Addendum)
X-ray showed no signs of a fracture.  This is likely a sprain.  Use the crutches for weightbearing as tolerated.  Use the brace for stability.  Rest, ice, compress and elevate the area.  Motrin and Tylenol for pain.  Follow-up 1 week primary care doctor for repeat imaging and referral to orthopedics symptoms persist.  Return to the ED with any worsening symptoms.

## 2018-05-20 NOTE — ED Triage Notes (Signed)
Pt states after stepping in the hole she heard a pop, was able to walk normally for apprx 30-60 mins, then had to walk on tippy toes and then couldn't walk at all.

## 2018-05-20 NOTE — ED Provider Notes (Signed)
[facility]-EMERGENCY DEPT Provider Note   CSN: [id number] Arrival date & time: 05/20/18  2010    History   Chief Complaint Chief Complaint  Patient presents with  . Ankle Pain  . Dislocation    HPI Cassandra Marsh is a 33 y.o. female.     HPI 33 year old female with no pertinent past medical history presents to the emergency department today for evaluation of left ankle injury.  Patient states that approximately 1700 this afternoon she stepped in a hole twisting her left ankle.  She was initially able to ambulate however the pain does not make her able to ambulate this time.  She reports pain to the dorsal aspect of the left foot and left ankle that radiates up to her left knee.  Patient denies any head injury or LOC.  Came by EMS.  Was given fentanyl in route by EMS which had temporary relief in her pain.  Pain is worse with palpation and range of motion.  Any paresthesias or weakness. Past Medical History:  Diagnosis Date  . Anxiety   . Diabetes mellitus without complication (HCC)   . Hypertension   . Mental disorder     Patient Active Problem List   Diagnosis Date Noted  . IUD (intrauterine device) in place 02/26/2018  . Abnormal glucose tolerance test (GTT) during pregnancy, antepartum 09/28/2017    Past Surgical History:  Procedure Laterality Date  . TONSILLECTOMY  2004     OB History    Gravida  1   Para  1   Term  1   Preterm  0   AB  0   Living  1     SAB  0   TAB  0   Ectopic  0   Multiple  0   Live Births  1            Home Medications    Prior to Admission medications   Medication Sig Start Date End Date Taking? Authorizing Provider  acetaminophen (TYLENOL) 500 MG tablet Take 1,000 mg by mouth daily as needed for mild pain or headache.   Yes [provider]  ibuprofen (ADVIL,MOTRIN) 600 MG tablet Take 1 tablet (600 mg total) by mouth every 6 (six) hours. Patient not taking: Reported on 01/29/2018  11/17/17   Allayne StackBeard, Samantha N, DO  NIFEdipine (ADALAT CC) 30 MG 24 hr tablet Take 1 tablet (30 mg total) by mouth daily. Patient not taking: Reported on 01/29/2018 11/17/17   Allayne StackBeard, Samantha N, DO  Prenat-Fe Poly-Methfol-FA-DHA (VITAFOL ULTRA) 29-0.6-0.4-200 MG CAPS Take 1 capsule by mouth daily. Patient not taking: Reported on 01/29/2018 10/04/17   Brock BadHarper, Charles A, MD  senna-docusate (SENOKOT-S) 8.6-50 MG tablet Take 2 tablets by mouth daily. Patient not taking: Reported on 01/29/2018 11/18/17   Allayne StackBeard, Samantha N, DO    Family History History reviewed. No pertinent family history.  Social History Social History   Tobacco Use  . Smoking status: Current Every Day Smoker    Packs/day: 0.50    Types: Cigarettes  . Smokeless tobacco: Never Used  Substance Use Topics  . Alcohol use: Yes    Comment: occasional glass of wine throughout pregnancy  . Drug use: Yes    Types: Marijuana    Comment: occasional use throughout pregnancy     Allergies   Patient has no known allergies.   Review of Systems Review of Systems  Eyes: Negative for discharge.  Musculoskeletal: Positive for arthralgias, gait problem, joint swelling and  myalgias.  Skin: Negative for color change and wound.  Neurological: Negative for weakness and numbness.     Physical Exam Updated Vital Signs BP 127/87   Pulse 87   Temp 98.3 F (36.8 C) (Oral)   Resp 16   Ht 5\' 2"  (1.575 m)   Wt 59.4 kg   LMP 05/13/2018   SpO2 98%   BMI 23.96 kg/m   Physical Exam Vitals signs and nursing note reviewed.  Constitutional:      General: She is not in acute distress.    Appearance: She is well-developed.  HENT:     Head: Normocephalic and atraumatic.  Eyes:     General: No scleral icterus.       Right eye: No discharge.        Left eye: No discharge.  Neck:     Musculoskeletal: Normal range of motion.  Cardiovascular:     Pulses: Normal pulses.  Pulmonary:     Effort: No respiratory distress.  Musculoskeletal:  Normal range of motion.     Comments: Patient has pain to palpation of the left dorsal aspect the foot.  No obvious deformity or ecchymosis.  Minimal range of motion left ankle secondary to pain.  Does have sensation that is intact.  Capillary refill normal.  DP pulses 2+ bilaterally.  Pain radiates up to left knee.  Does have pain with range of motion of left knee.  No obvious deformity or ecchymosis.  Skin compartments are soft.  Able to move all phalanges.  Skin:    General: Skin is warm and dry.     Capillary Refill: Capillary refill takes less than 2 seconds.     Coloration: Skin is not pale.  Neurological:     Mental Status: She is alert.  Psychiatric:        Mood and Affect: Mood normal.        Behavior: Behavior normal.        Thought Content: Thought content normal.        Judgment: Judgment normal.      ED Treatments / Results  Labs (all labs ordered are listed, but only abnormal results are displayed) Labs Reviewed - No data to display  EKG None  Radiology Dg Tibia/fibula Left  Result Date: 05/20/2018 CLINICAL DATA:  Left lower leg injury today when the patient stepped in a hole. Initial encounter. EXAM: LEFT TIBIA AND FIBULA - 2 VIEW COMPARISON:  None. FINDINGS: There is no evidence of fracture or other focal bone lesions. Soft tissues are unremarkable. IMPRESSION: Negative exam. Electronically Signed   By: Drusilla Kanner M.D.   On: 05/20/2018 21:16   Dg Ankle 2 Views Right  Result Date: 05/20/2018 CLINICAL DATA:  The patient suffered a left ankle injury when she stepped in a hole today. Pain. Initial encounter. EXAM: RIGHT ANKLE - 2 VIEW COMPARISON:  None. FINDINGS: There is no evidence of fracture, dislocation, or joint effusion. There is no evidence of arthropathy or other focal bone abnormality. Soft tissues are unremarkable. IMPRESSION: Negative exam. Electronically Signed   By: Drusilla Kanner M.D.   On: 05/20/2018 21:24   Dg Knee Complete 4 Views Left  Result  Date: 05/20/2018 CLINICAL DATA:  Left knee injury today when the patient stepped in a hole. Initial encounter. EXAM: LEFT KNEE - COMPLETE 4+ VIEW COMPARISON:  None. FINDINGS: No evidence of fracture, dislocation, or joint effusion. No evidence of arthropathy or other focal bone abnormality. Soft tissues are unremarkable. IMPRESSION: Normal exam.  Electronically Signed   By: Drusilla Kanner M.D.   On: 05/20/2018 21:15   Dg Foot Complete Left  Result Date: 05/20/2018 CLINICAL DATA:  Patient suffered a left foot injury when she stepped in a hole. Pain. Initial encounter. EXAM: LEFT FOOT - COMPLETE 3+ VIEW COMPARISON:  None. FINDINGS: There is no evidence of fracture or dislocation. There is no evidence of arthropathy or other focal bone abnormality. Soft tissues are unremarkable. IMPRESSION: Negative exam. Electronically Signed   By: Drusilla Kanner M.D.   On: 05/20/2018 21:15    Procedures Procedures (including critical care time)  Medications Ordered in ED Medications  oxyCODONE-acetaminophen (PERCOCET/ROXICET) 5-325 MG per tablet 1 tablet (1 tablet Oral Given 05/20/18 2119)     Initial Impression / Assessment and Plan / ED Course  I have reviewed the triage vital signs and the nursing notes.  Pertinent labs & imaging results that were available during my care of the patient were reviewed by me and considered in my medical decision making (see chart for details).        Patient X-Ray negative for obvious fracture or dislocation.  Patient neurovascularly intact.  Pain managed in ED. Pt advised to follow up with pcp if symptoms persist for possibility of missed fracture diagnosis. Patient given brace while in ED, conservative therapy recommended and discussed. Patient will be dc home & is agreeable with above plan.  Pt is hemodynamically stable, in NAD, & able to ambulate in the ED. Evaluation does not show pathology that would require ongoing emergent intervention or inpatient treatment. I  explained the diagnosis to the patient. Pain has been managed & has no complaints prior to dc. Pt is comfortable with above plan and is stable for discharge at this time. All questions were answered prior to disposition. Strict return precautions for f/u to the ED were discussed. Encouraged follow up with PCP.   Final Clinical Impressions(s) / ED Diagnoses   Final diagnoses:  Acute left ankle pain    ED Discharge Orders    None       Wallace Keller 05/20/18 2135    Tegeler, Canary Brim, MD 05/20/18 2223

## 2018-05-20 NOTE — ED Notes (Signed)
Zach RN notified ortho of orders.

## 2018-09-09 ENCOUNTER — Ambulatory Visit: Payer: Medicaid Other | Admitting: Advanced Practice Midwife

## 2018-09-09 ENCOUNTER — Encounter: Payer: Self-pay | Admitting: Advanced Practice Midwife

## 2018-09-09 ENCOUNTER — Other Ambulatory Visit: Payer: Self-pay

## 2018-09-09 VITALS — BP 138/85 | HR 90 | Temp 99.1°F | Wt 138.0 lb

## 2018-09-09 DIAGNOSIS — R0789 Other chest pain: Secondary | ICD-10-CM

## 2018-09-09 DIAGNOSIS — Z3202 Encounter for pregnancy test, result negative: Secondary | ICD-10-CM | POA: Diagnosis not present

## 2018-09-09 DIAGNOSIS — R5383 Other fatigue: Secondary | ICD-10-CM

## 2018-09-09 DIAGNOSIS — N921 Excessive and frequent menstruation with irregular cycle: Secondary | ICD-10-CM | POA: Diagnosis not present

## 2018-09-09 DIAGNOSIS — N939 Abnormal uterine and vaginal bleeding, unspecified: Secondary | ICD-10-CM

## 2018-09-09 DIAGNOSIS — Z30431 Encounter for routine checking of intrauterine contraceptive device: Secondary | ICD-10-CM

## 2018-09-09 LAB — POCT URINE PREGNANCY: Preg Test, Ur: NEGATIVE

## 2018-09-09 MED ORDER — NORGESTIMATE-ETH ESTRADIOL 0.25-35 MG-MCG PO TABS: 1.0000 | ORAL_TABLET | Freq: Every day | ORAL | 3 refills | Status: DC

## 2018-09-09 NOTE — Progress Notes (Signed)
History:  [name] is a 33 y.o. G1P1001 who presents to [facility] today for evaluation for sudden onset of abnormal vaginal bleeding and cramping x6 weeks.  She has Mirena IUD that was placed in January. Did not have any problems until about 6 weeks ago. Reports she can feel her IUD strings. She reports feeling tired all the time, despite getting 7-8 hours of sleep. She is also reporting burning in her mid chest and palpitations for about 1 month. Was evaluated by Urgent Care and was told it was acid reflux. Rx'd Prilosec, which she started taking yesterday. She denies fever, cough, SOB or pain.  The following portions of the patient's history were reviewed and updated as appropriate: allergies, current medications, family history, past medical history, social history, past surgical history and problem list.  Review of Systems:  Review of Systems  Constitutional: Positive for malaise/fatigue.  HENT: Negative.   Eyes: Negative.   Respiratory: Negative.   Cardiovascular: Positive for palpitations.       Pt reports burning like sensation in mid chest that radiates into throat  Gastrointestinal:       Pt describes burning in mid chest that radiates upward into throat, but reports that it is not heartburn   Genitourinary: Negative.   Musculoskeletal: Negative.   Skin: Negative.   Neurological: Negative.   Endo/Heme/Allergies: Negative.   Psychiatric/Behavioral: Negative.       Objective:  Physical Exam BP 138/85   Pulse 90   Temp 99.1 F (37.3 C) (Oral)   Wt 62.6 kg   Breastfeeding No Comment: irregular bleeding   BMI 25.24 kg/m  Physical Exam Vitals signs and nursing note reviewed.  Constitutional:      Appearance: Normal appearance.  HENT:     Head: Normocephalic and atraumatic.     Nose: Nose normal.     Mouth/Throat:     Mouth: Mucous membranes are moist.  Eyes:     Extraocular Movements: Extraocular movements intact.     Conjunctiva/sclera: Conjunctivae  normal.     Pupils: Pupils are equal, round, and reactive to light.  Cardiovascular:     Rate and Rhythm: Normal rate and regular rhythm.     Pulses: Normal pulses.     Heart sounds: Normal heart sounds.  Pulmonary:     Effort: Pulmonary effort is normal.     Breath sounds: Normal breath sounds.  Abdominal:     General: Abdomen is flat.     Palpations: Abdomen is soft.  Genitourinary:    General: Normal vulva.     Vagina: Vaginal discharge present.     Comments: Bloody   Musculoskeletal: Normal range of motion.  Skin:    General: Skin is warm and dry.  Neurological:     General: No focal deficit present.     Mental Status: She is alert and oriented to person, place, and time.  Psychiatric:        Mood and Affect: Mood normal.        Behavior: Behavior normal.        Thought Content: Thought content normal.        Judgment: Judgment normal.   Labs and Imaging Results for orders placed or performed in visit on 09/09/18 (from the past 24 hour(s))  POCT urine pregnancy     Status: None   Collection Time: 09/09/18  4:44 PM  Result Value Ref Range   Preg Test, Ur Negative Negative    No results found.   Assessment &  Plan:  1. Fatigue, unspecified type - Vaginal bleeding x6 weeks - Constant fatigue despite normal sleep/rest - Will check CBC and TSH  - CBC - TSH  2. Other chest pain -Pt describes burning sensation in chest that radiates into throat - Was evaluated by Urgent Care recently for same symptoms -Likely GERD -Will collect TSH -Will make referral to cardiology  - TSH  3. Abnormal vaginal bleeding -Vaginal bleeding x6 weeks -UPT negative -IUD strings visualized. IUD appears to be in correct position - POCT urine pregnancy  4. Breakthrough bleeding associated with intrauterine device (IUD) -Vaginal bleeding and cramping x6 weeks -IUD strings visualized. IUD appears to be in correct position    Camelia EngDanielle Dorothe Elmore, [facility] 09/09/2018 5:13 PM

## 2018-09-09 NOTE — Progress Notes (Signed)
RGYN pt presents for IUD Check and  problem visit today.  Inserted: 01/29/18  CC: Pelvic Pain and abnormal bleeding.and moody pain is lower pelvic area and now . Pt states she has been bleeding for 6 weeks Notes when IUD was inserted she did not have irregular bleeding.  Pt wants to also discuss hormone labs.   PHQ-9 = 2   Patient consents to Resident /student being present in room.   UPT : NEGATIVE

## 2018-09-10 LAB — CBC
Hematocrit: 46.1 % (ref 34.0–46.6)
Hemoglobin: 15.7 g/dL (ref 11.1–15.9)
MCH: 31 pg (ref 26.6–33.0)
MCHC: 34.1 g/dL (ref 31.5–35.7)
MCV: 91 fL (ref 79–97)
Platelets: 302 10*3/uL (ref 150–450)
RBC: 5.07 x10E6/uL (ref 3.77–5.28)
RDW: 12.9 % (ref 11.7–15.4)
WBC: 11.7 10*3/uL — ABNORMAL HIGH (ref 3.4–10.8)

## 2018-09-10 LAB — TSH: TSH: 1.59 u[IU]/mL (ref 0.450–4.500)

## 2018-09-17 ENCOUNTER — Telehealth: Payer: Self-pay | Admitting: Advanced Practice Midwife

## 2018-09-17 ENCOUNTER — Other Ambulatory Visit: Payer: Self-pay | Admitting: Advanced Practice Midwife

## 2018-09-17 DIAGNOSIS — Z9189 Other specified personal risk factors, not elsewhere classified: Secondary | ICD-10-CM

## 2018-09-17 NOTE — Telephone Encounter (Signed)
Returned pt call. Pt reports feelings of anger and frustration and frequent crying.  She did not feel this way before starting OCPs for breakthrough bleeding with IUD.  Pt denies any thoughts of harming herself or others.  Pt to d/c OCPs now.  Message sent to office to set up [facility] visit with Kittie Plater to discuss her recent mood changes.  Follow up in office as needed.

## 2018-09-17 NOTE — Telephone Encounter (Signed)
Returned pt call. Pt called office over the weekend, message routed today when office open again after Labor Day holiday.  Left message for pt to return call or message me via MyChart.

## 2018-09-17 NOTE — Progress Notes (Signed)
duplicate

## 2018-09-19 ENCOUNTER — Other Ambulatory Visit: Payer: Self-pay

## 2018-09-19 ENCOUNTER — Telehealth (INDEPENDENT_AMBULATORY_CARE_PROVIDER_SITE_OTHER): Payer: Medicaid Other | Admitting: Clinical

## 2018-09-19 DIAGNOSIS — F4323 Adjustment disorder with mixed anxiety and depressed mood: Secondary | ICD-10-CM

## 2018-09-19 NOTE — BH Specialist Note (Cosign Needed)
Integrated Behavioral Health via Telemedicine Video Visit  09/19/2018 Cassandra Marsh [id number]  Number of [place] visits: 1 Session Start time: 10:46  Session End time: 11:38 Total time: 50 minutes  Referring Provider: Fatima Blank, CNM Type of Visit: Video Patient/Family location: Home [facility] Provider location: [facility] All persons participating in visit: Patient Cassandra Marsh and [place] and MSW Intern Audria Nine  Confirmed patient's address: Yes  Confirmed patient's phone number: Yes  Any changes to demographics: No   Confirmed patient's insurance: Yes  Any changes to patient's insurance: No   Discussed confidentiality: Yes   I connected with Cassandra Marsh  by a video enabled telemedicine application and verified that I am speaking with the correct person using two identifiers.     I discussed the limitations of evaluation and management by telemedicine and the availability of in person appointments.  I discussed that the purpose of this visit is to provide behavioral health care while limiting exposure to the novel coronavirus.   Discussed there is a possibility of technology failure and discussed alternative modes of communication if that failure occurs.  I discussed that engaging in this video visit, they consent to the provision of behavioral healthcare and the services will be billed under their insurance.  Patient and/or legal guardian expressed understanding and consented to video visit: Yes   PRESENTING CONCERNS: Patient and/or family reports the following symptoms/concerns: Pt states her primary concern today is excessive crying, frustration, lack of quality sleep, worry about gaining weight, fatigue, and anhedonia;  pt feels best when she has time to dig in the garden.  Duration of problem: Increase after son born 62 months ago; Severity of problem: moderate  STRENGTHS (Protective Factors/Coping Skills): Self-aware; Beginning  to be open to talk about feelings  GOALS ADDRESSED: Patient will: 1.  Reduce symptoms of: anxiety, depression and stress  2.  Increase knowledge and/or ability of: stress reduction  3.  Demonstrate ability to: Increase healthy adjustment to current life circumstances, Increase adequate support systems for patient/family and Increase motivation to adhere to plan of care  INTERVENTIONS: Interventions utilized:  Veterinary surgeon, Psychoeducation and/or Health Education and Link to [employer] Standardized Assessments completed: GAD-7 and PHQ 9  ASSESSMENT: Patient currently experiencing Adjustment disorder with depression and anxiety.   Patient may benefit from psychoeducation and brief therapeutic interventions regarding coping with symptoms of depression and anxiety .  PLAN: 1. Follow up with behavioral health clinician on : One week 2. Behavioral recommendations:  -Begin taking BH medication as prescribed -Consider resuming daily work in [place] and attend at least one new mom support group at either [email or web address] or postpartum.net 3. Referral(s): Integrated Orthoptist (In Clinic) and [employer] Resources:  New mom support  I discussed the assessment and treatment plan with the patient and/or parent/guardian. They were provided an opportunity to ask questions and all were answered. They agreed with the plan and demonstrated an understanding of the instructions.   They were advised to call back or seek an in-person evaluation if the symptoms worsen or if the condition fails to improve as anticipated.  Caroleen Hamman Akire Rennert   Depression screen [facility] 2/9 09/19/2018 09/09/2018 09/28/2017  Decreased Interest 3 0 0  Down, Depressed, Hopeless 1 1 0  PHQ - 2 Score 4 1 0  Altered sleeping 1 0 -  Tired, decreased energy 3 1 -  Change in appetite 1 0 -  Feeling bad or failure about yourself  1 0 -  Trouble concentrating 0 0 -  Moving slowly or  fidgety/restless 1 0 -  Suicidal thoughts 0 0 -  PHQ-9 Score 11 2 -  Difficult doing work/chores - Not difficult at all -   GAD 7 : Generalized Anxiety Score 09/19/2018  Nervous, Anxious, on Edge 1  Control/stop worrying 1  Worry too much - different things 1  Trouble relaxing 1  Restless 1  Easily annoyed or irritable 1  Afraid - awful might happen 1  Total GAD 7 Score 7

## 2018-09-20 ENCOUNTER — Other Ambulatory Visit: Payer: Self-pay | Admitting: Advanced Practice Midwife

## 2018-09-20 MED ORDER — ESCITALOPRAM OXALATE 10 MG PO TABS: 10.0000 mg | ORAL_TABLET | Freq: Every day | ORAL | 11 refills | Status: AC

## 2018-09-20 NOTE — Progress Notes (Signed)
Pt desires antidepressants after integrated behavioral health visit.  She has been on Lexapro in the past. Lexapro 10 mg daily to start today, follow up in office in 1 month.

## 2018-09-23 ENCOUNTER — Telehealth: Payer: Self-pay | Admitting: Clinical

## 2018-09-23 NOTE — Telephone Encounter (Signed)
Integrated Behavioral Health Medication Management Phone Note  MRN: [id number] NAME: Armenta Erskin  Time Call Initiated: 10:47 Time Call Completed: 10:50 Total Call Time: 15 minutes  Current Medications:  Outpatient Medications Prior to Visit  Medication Sig Dispense Refill  . acetaminophen (TYLENOL) 500 MG tablet Take 1,000 mg by mouth daily as needed for mild pain or headache.    . escitalopram (LEXAPRO) 10 MG tablet Take 1 tablet (10 mg total) by mouth daily. 30 tablet 11  . ibuprofen (ADVIL,MOTRIN) 600 MG tablet Take 1 tablet (600 mg total) by mouth every 6 (six) hours. (Patient not taking: Reported on 01/29/2018) 30 tablet 0  . Multiple Vitamins-Minerals (MULTIVITAMIN ADULT PO) Take by mouth.    Marland Kitchen NIFEdipine (ADALAT CC) 30 MG 24 hr tablet Take 1 tablet (30 mg total) by mouth daily. (Patient not taking: Reported on 01/29/2018) 30 tablet 0  . norgestimate-ethinyl estradiol (ORTHO-CYCLEN) 0.25-35 MG-MCG tablet Take 1 tablet by mouth daily. 1 Package 3  . Prenat-Fe Poly-Methfol-FA-DHA (VITAFOL ULTRA) 29-0.6-0.4-200 MG CAPS Take 1 capsule by mouth daily. (Patient not taking: Reported on 01/29/2018) 30 capsule 12  . senna-docusate (SENOKOT-S) 8.6-50 MG tablet Take 2 tablets by mouth daily. (Patient not taking: Reported on 01/29/2018) 15 tablet 0   No facility-administered medications prior to visit.     Patient has been able to get all medications filled as prescribed: Yes  Patient is currently taking all medications as prescribed: Yes  Patient reports experiencing side effects: No  Patient describes feeling this way on medications: Pt says "don't feel any different yet, still a little trouble sleeping", that she attributes to son teething(4 teeth coming in at same time)  Additional patient concerns: None at this time  Patient advised to schedule appointment with provider for evaluation of medication side effects or additional concerns: No Pt will arrive to previously-scheduled visit  with [facility] on 09/26/18   Garlan Fair, LCSW

## 2018-09-26 ENCOUNTER — Other Ambulatory Visit: Payer: Self-pay

## 2018-09-26 ENCOUNTER — Ambulatory Visit (INDEPENDENT_AMBULATORY_CARE_PROVIDER_SITE_OTHER): Payer: Medicaid Other | Admitting: Clinical

## 2018-09-26 DIAGNOSIS — F4323 Adjustment disorder with mixed anxiety and depressed mood: Secondary | ICD-10-CM

## 2018-09-26 NOTE — BH Specialist Note (Signed)
Integrated Behavioral Health via Telemedicine Telephone Visit  09/26/2018 Cassandra Marsh [id number]  Number of [place] visits: 2 Session Start time: 10:45  Session End time: 10:59 Total time: 15 minutes  Referring Provider: Fatima Blank, CNM Type of Visit: Telephone Patient/Family location: Home [facility] Provider location: [facility] All persons participating in visit: Patient Cassandra Marsh and Cassandra Marsh   Confirmed patient's address: Yes  Confirmed patient's phone number: Yes  Any changes to demographics: No   Confirmed patient's insurance: Yes  Any changes to patient's insurance: No   Discussed confidentiality: At previous visit  I connected with Cassandra Marsh by a telephone enabled telemedicine application and verified that I am speaking with the correct person using two identifiers.     I discussed the limitations of evaluation and management by telemedicine and the availability of in person appointments.  I discussed that the purpose of this visit is to provide behavioral health care while limiting exposure to the novel coronavirus.   Discussed there is a possibility of technology failure and discussed alternative modes of communication if that failure occurs.  I discussed that engaging in this telephone visit, they consent to the provision of behavioral healthcare and the services will be billed under their insurance.  Patient and/or legal guardian expressed understanding and consented to video visit: Yes   PRESENTING CONCERNS: Patient and/or family reports the following symptoms/concerns: Pt states she has been "feeling a little better" but states "no energy, really bad shakes and feeling weak, sleep is crazy and my appetite is gone, and sometimes feel like I'm sweating and freezing at the same time"; pt says she has been taking medication at different times each day (at night; next day in morning, next day in afternoon), feels exhausted upon  waking, 2 cups coffee/day.  Duration of problem: Postpartum; Severity of problem: moderate  STRENGTHS (Protective Factors/Coping Skills): Self-aware and open to treatment  GOALS ADDRESSED: Patient will: 1.  Reduce symptoms of: anxiety, depression and stress  2.  Increase knowledge and/or ability of: healthy habits  3.  Demonstrate ability to: Increase healthy adjustment to current life circumstances, Increase adequate support systems for patient/family and Increase motivation to adhere to plan of care  INTERVENTIONS: Interventions utilized:  Medication Monitoring, Sleep Hygiene and Link to [employer] Standardized Assessments completed: Not Needed  ASSESSMENT: Patient currently experiencing Adjustment disorder with mixed depression and anxiety.   Patient may benefit from continued brief therapeutic interventions regarding coping with symptoms of depression and anxiety.  PLAN: 1. Follow up with behavioral health clinician on : One week 2. Behavioral recommendations:  -Continue taking medication as prescribed. If taking today at 11am, set timer on phone for 11am every day, and take medication at same time daily -After first cup of morning coffee, set timer for 10 or 20 minute nap, while baby is in [place] -Consider registering for at least one new mom online support group -Continue doing outdoor work on non-rainy days 3. Referral(s): Integrated Orthoptist (In Clinic) and [employer] Resources:  New mom support  I discussed the assessment and treatment plan with the patient and/or parent/guardian. They were provided an opportunity to ask questions and all were answered. They agreed with the plan and demonstrated an understanding of the instructions.   They were advised to call back or seek an in-person evaluation if the symptoms worsen or if the condition fails to improve as anticipated.  Cassandra Marsh Cassandra Marsh

## 2018-10-10 ENCOUNTER — Other Ambulatory Visit: Payer: Self-pay

## 2018-10-10 ENCOUNTER — Ambulatory Visit: Payer: Medicaid Other | Admitting: Clinical

## 2018-10-10 DIAGNOSIS — F4323 Adjustment disorder with mixed anxiety and depressed mood: Secondary | ICD-10-CM

## 2018-10-10 NOTE — BH Specialist Note (Signed)
Integrated Behavioral Health via Telemedicine Video Visit  10/10/2018 Tyshika Baldridge [id number]  Number of [place] visits: 3 Session Start time: 1:18 Session End time: 1:30 Total time: 15 minutes  Referring Provider: Fatima Blank, CNM Type of Visit: Video Patient/Family location: Home [facility] Provider location: [facility] All persons participating in visit: Patient Angelique Chevalier and [place]  Confirmed patient's address: Yes  Confirmed patient's phone number: Yes  Any changes to demographics: No   Confirmed patient's insurance: Yes  Any changes to patient's insurance: No   Discussed confidentiality: At previous visit  I connected with [name] by a video enabled telemedicine application and verified that I am speaking with the correct person using two identifiers.     I discussed the limitations of evaluation and management by telemedicine and the availability of in person appointments.  I discussed that the purpose of this visit is to provide behavioral health care while limiting exposure to the novel coronavirus.   Discussed there is a possibility of technology failure and discussed alternative modes of communication if that failure occurs.  I discussed that engaging in this video visit, they consent to the provision of behavioral healthcare and the services will be billed under their insurance.  Patient and/or legal guardian expressed understanding and consented to video visit: Yes   PRESENTING CONCERNS: Patient and/or family reports the following symptoms/concerns: Pt states that since taking [place] medication every day at the same time, along with continuing to stay busy with yard work and joining an online support group, she is feeling like she is managing her symptoms of anxiety and depression well.  Duration of problem: Postpartum; Severity of problem: moderate  STRENGTHS (Protective Factors/Coping Skills): Self-awareness; open to  treatment  GOALS ADDRESSED: Patient will: 1.  Reduce symptoms of: anxiety, depression and stress  2.  Demonstrate ability to: Increase healthy adjustment to current life circumstances and Improve medication compliance  INTERVENTIONS: Interventions utilized:  Medication Monitoring Standardized Assessments completed: Not Needed  ASSESSMENT: Patient currently experiencing Adjustment disorder with mixed depression and anxiety   Patient may benefit from continued brief therapeutic intervention today to maintain reduction of depression and anxiety.  PLAN: 1. Follow up with behavioral health clinician on : As needed 2. Behavioral recommendations:  -Continue taking BH medication as prescribed -Continue participating in online support group  -Continue working on outdoor projects daily, as weather permits -Consider [facility] for PCP 3. Referral(s): PCP  I discussed the assessment and treatment plan with the patient and/or parent/guardian. They were provided an opportunity to ask questions and all were answered. They agreed with the plan and demonstrated an understanding of the instructions.   They were advised to call back or seek an in-person evaluation if the symptoms worsen or if the condition fails to improve as anticipated.  Caroleen Hamman McMannes

## 2018-10-17 NOTE — Progress Notes (Deleted)
Cardiology Office Note:    Date:  10/17/2018   ID:  Cassandra Marsh, DOB [birth year], MRN [id number]  PCP:  Patient, No Pcp Per  Cardiologist:  No primary care provider on file.  Electrophysiologist:  None   Referring MD: Hurshel Party,*   No chief complaint on file. ***  History of Present Illness:    Cassandra Marsh is a 33 y.o. female with a hx of diabetes, hypertension who presents with chest pain.  Past Medical History:  Diagnosis Date  . Anxiety   . Diabetes mellitus without complication (HCC)   . Hypertension   . Mental disorder     Past Surgical History:  Procedure Laterality Date  . TONSILLECTOMY  2004    Current Medications: No outpatient medications have been marked as taking for the 10/18/18 encounter (Appointment) with Little Ishikawa, MD.     Allergies:   Patient has no known allergies.   Social History   Socioeconomic History  . Marital status: Legally Separated    Spouse name: Not on file  . Number of children: Not on file  . Years of education: Not on file  . Highest education level: Not on file  Occupational History  . Not on file  Social Needs  . Financial resource strain: Not on file  . Food insecurity    Worry: Not on file    Inability: Not on file  . Transportation needs    Medical: Not on file    Non-medical: Not on file  Tobacco Use  . Smoking status: Current Every Day Smoker    Packs/day: 0.50    Types: Cigarettes  . Smokeless tobacco: Never Used  Substance and Sexual Activity  . Alcohol use: Yes    Comment: occasional glass of wine throughout pregnancy  . Drug use: Yes    Types: Marijuana    Comment: occasional use throughout pregnancy  . Sexual activity: Yes    Partners: Male    Birth control/protection: I.U.D.  Lifestyle  . Physical activity    Days per week: Not on file    Minutes per session: Not on file  . Stress: Not on file  Relationships  . Social Musician on phone: Not on file   Gets together: Not on file    Attends religious service: Not on file    Active member of club or organization: Not on file    Attends meetings of clubs or organizations: Not on file    Relationship status: Not on file  Other Topics Concern  . Not on file  Social History Narrative  . Not on file     Family History: The patient's ***family history is not on file.  ROS:   Please see the history of present illness.    *** All other systems reviewed and are negative.  EKGs/Labs/Other Studies Reviewed:    The following studies were reviewed today: ***  EKG:  EKG is *** ordered today.  The ekg ordered today demonstrates ***  Recent Labs: 11/14/2017: ALT 12; BUN 9; Creatinine, Ser 0.60; Potassium 4.3; Sodium 136 09/09/2018: Hemoglobin 15.7; Platelets 302; TSH 1.590  Recent Lipid Panel No results found for: CHOL, TRIG, HDL, CHOLHDL, VLDL, LDLCALC, LDLDIRECT  Physical Exam:    VS:  There were no vitals taken for this visit.    Wt Readings from Last 3 Encounters:  09/09/18 138 lb (62.6 kg)  05/20/18 131 lb (59.4 kg)  02/26/18 131 lb (59.4 kg)  GEN: *** Well nourished, well developed in no acute distress HEENT: Normal NECK: No JVD; No carotid bruits LYMPHATICS: No lymphadenopathy CARDIAC: ***RRR, no murmurs, rubs, gallops RESPIRATORY:  Clear to auscultation without rales, wheezing or rhonchi  ABDOMEN: Soft, non-tender, non-distended MUSCULOSKELETAL:  No edema; No deformity  SKIN: Warm and dry NEUROLOGIC:  Alert and oriented x 3 PSYCHIATRIC:  Normal affect   ASSESSMENT:    No diagnosis found. PLAN:    In order of problems listed above:  Chest pain:  Hypertension: On nifedipine 30 mg daily  Diabetes   Medication Adjustments/Labs and Tests Ordered: Current medicines are reviewed at length with the patient today.  Concerns regarding medicines are outlined above.  No orders of the defined types were placed in this encounter.  No orders of the defined types were  placed in this encounter.   There are no Patient Instructions on file for this visit.   Signed, Donato Heinz, MD  10/17/2018 3:20 PM    [facility]

## 2018-10-18 ENCOUNTER — Ambulatory Visit: Payer: Medicaid Other | Admitting: Cardiology

## 2018-10-22 ENCOUNTER — Telehealth (INDEPENDENT_AMBULATORY_CARE_PROVIDER_SITE_OTHER): Payer: Medicaid Other | Admitting: Advanced Practice Midwife

## 2018-10-22 ENCOUNTER — Encounter: Payer: Self-pay | Admitting: Advanced Practice Midwife

## 2018-10-22 ENCOUNTER — Other Ambulatory Visit: Payer: Self-pay

## 2018-10-22 VITALS — BP 123/89 | HR 99 | Ht 62.0 in

## 2018-10-22 DIAGNOSIS — L739 Follicular disorder, unspecified: Secondary | ICD-10-CM

## 2018-10-22 DIAGNOSIS — N764 Abscess of vulva: Secondary | ICD-10-CM | POA: Diagnosis not present

## 2018-10-22 DIAGNOSIS — Z9189 Other specified personal risk factors, not elsewhere classified: Secondary | ICD-10-CM

## 2018-10-22 MED ORDER — SULFAMETHOXAZOLE-TRIMETHOPRIM 800-160 MG PO TABS: 1.0000 | ORAL_TABLET | Freq: Two times a day (BID) | ORAL | 0 refills | Status: AC

## 2018-10-22 NOTE — Progress Notes (Signed)
TELEHEALTH GYNECOLOGY VIRTUAL VIDEO VISIT ENCOUNTER NOTE  Provider location: [facility] for [employer] at [place]   I connected with Cassandra Marsh on 10/22/18 at  3:15 PM EDT by MyChart Video Encounter at home and verified that I am speaking with the correct person using two identifiers.   I discussed the limitations, risks, security and privacy concerns of performing an evaluation and management service virtually and the availability of in person appointments. I also discussed with the patient that there may be a patient responsible charge related to this service. The patient expressed understanding and agreed to proceed.   History:  Cassandra Marsh is a 33 y.o. G2P1001 female being evaluated today for depressive symptoms on OCPs. OCPs prescribed for breakthrough bleeding with IUD but then symptoms of depression started.  OCPs were discontinued 1 month ago and pt has followed up with counselor.     Past Medical History:  Diagnosis Date  . Anxiety   . Diabetes mellitus without complication ([place])   . Hypertension   . Mental disorder    Past Surgical History:  Procedure Laterality Date  . TONSILLECTOMY  2004   The following portions of the patient's history were reviewed and updated as appropriate: allergies, current medications, past family history, past medical history, past social history, past surgical history and problem list.   Review of Systems:  Pertinent items noted in HPI and remainder of comprehensive ROS otherwise negative.  Physical Exam:   General:  Alert, oriented and cooperative. Patient appears to be in no acute distress.  Mental Status: Normal mood and affect. Normal behavior. Normal judgment and thought content.   Respiratory: Normal respiratory effort, no problems with respiration noted  Rest of physical exam deferred due to type of encounter  Labs and Imaging No results found for this or any previous visit (from the past 336 hour(s)). No results found.      Assessment and Plan:     1. Folliculitis --See abscess  2. At risk for depression --Appt today scheduled because pt had anxiety/depression symptoms after OCPs prescribed for breakthrough bleeding with IUD. She reports no symptoms of depression before the OCPs and feels much better now that she is not taking them.  She met with integrated behavioral health a few times and reports she is doing well and will follow up with counseling and/or our office as needed. --Her AUB has resolved and bleeding is light and regular now with her IUD  3. Abscess of genital labia --She reports a small bump on her labia that she noticed 11 months ago when she had her baby. It has not been painful or bothered her until this week. After intercourse with her husband it became swollen and more painful.  She denies any concerns or risks for STI.    --May be abscess, no hx of bartholins gland problems, so will treat with abx x 1 week. --F/U in 1 month in office if symptoms persist - sulfamethoxazole-trimethoprim (BACTRIM DS) 800-160 MG tablet; Take 1 tablet by mouth 2 (two) times daily for 7 days.  Dispense: 14 tablet; Refill: 0       I discussed the assessment and treatment plan with the patient. The patient was provided an opportunity to ask questions and all were answered. The patient agreed with the plan and demonstrated an understanding of the instructions.   The patient was advised to call back or seek an in-person evaluation/go to the ED if the symptoms worsen or if the condition fails to  improve as anticipated.  I provided 10 minutes of face-to-face time during this encounter.   Fatima Blank, [facility] for [employer], [place]

## 2018-10-22 NOTE — Progress Notes (Signed)
I connected with  Cassandra Marsh on 10/22/18 by a video enabled telemedicine application and verified that I am speaking with the correct person using two identifiers.  GYN FU.  C/o bump on edge of vagina that hurts 1-2/10. Its uncomfortable to wear underwear/jeans x 11 months.

## 2018-11-04 ENCOUNTER — Encounter: Payer: Self-pay | Admitting: Cardiology

## 2018-11-14 ENCOUNTER — Telehealth: Payer: Self-pay | Admitting: *Deleted

## 2018-11-14 ENCOUNTER — Encounter: Payer: Self-pay | Admitting: Cardiology

## 2018-11-14 NOTE — Telephone Encounter (Signed)
A message was left, re: her new patient appointment. 

## 2019-09-07 IMAGING — US US MFM OB FOLLOW-UP
1 series · 14 of 28 positions shown · non-contrast
Comparison: none

[Series 1: us mfm ob follow-up · 14 of 42 slices shown]
[im 2/42]
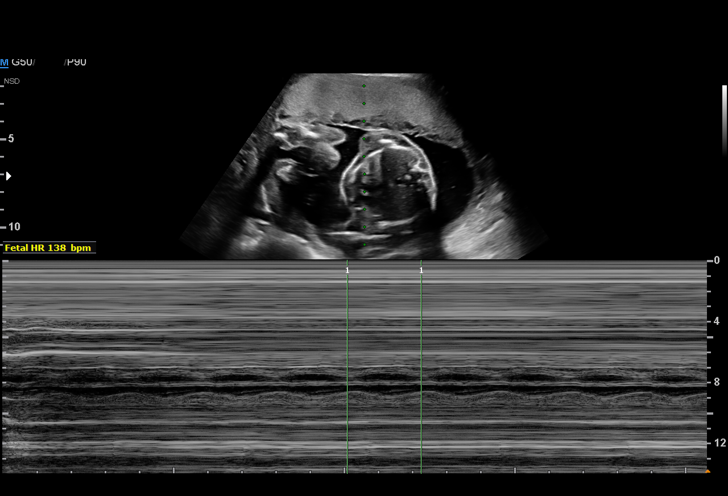
[im 5/42]
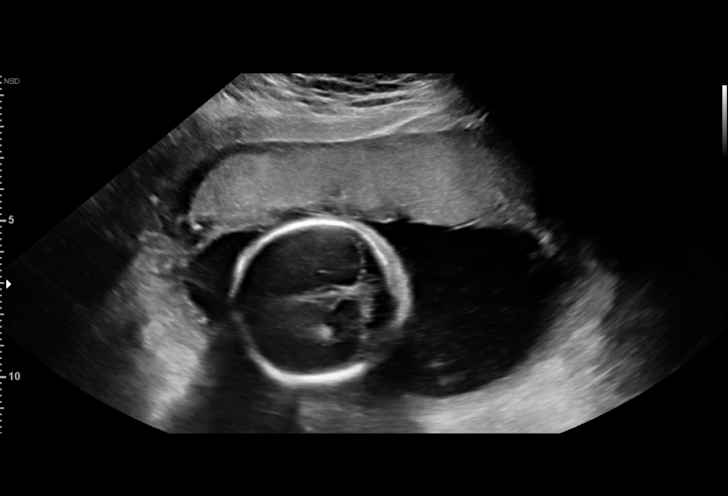
[im 8/42]
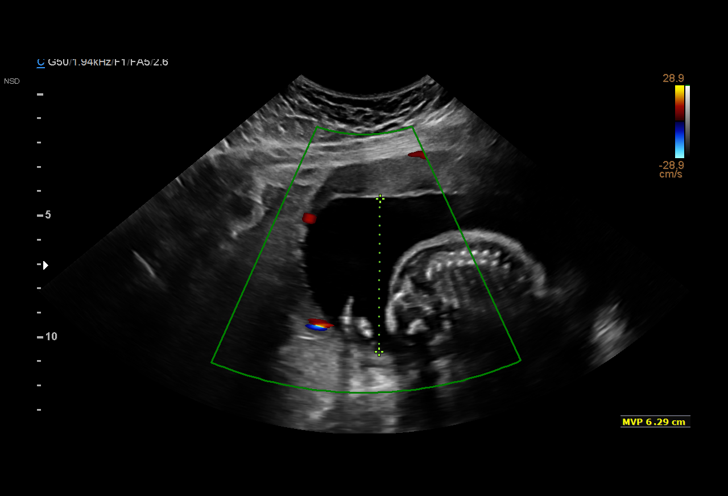
[im 11/42]
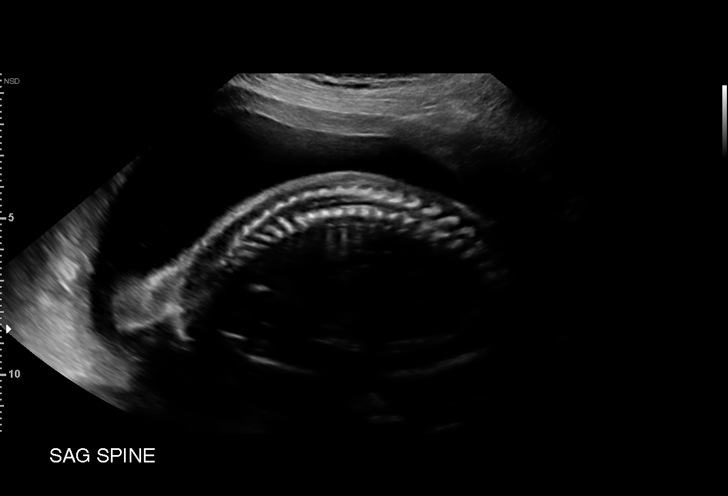
[im 14/42]
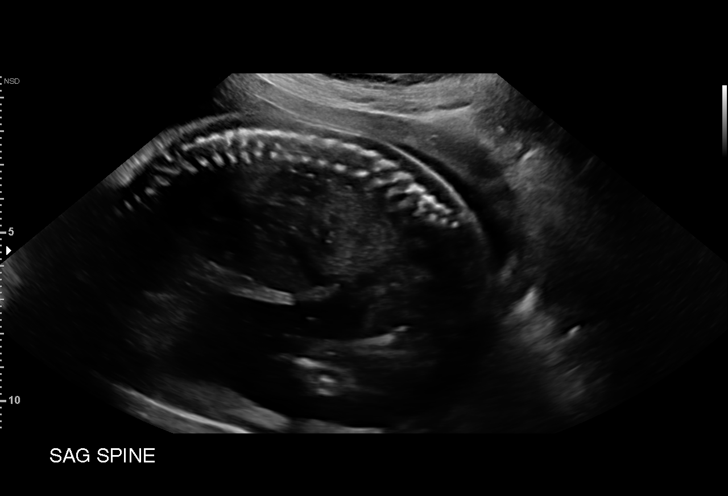
[im 17/42]
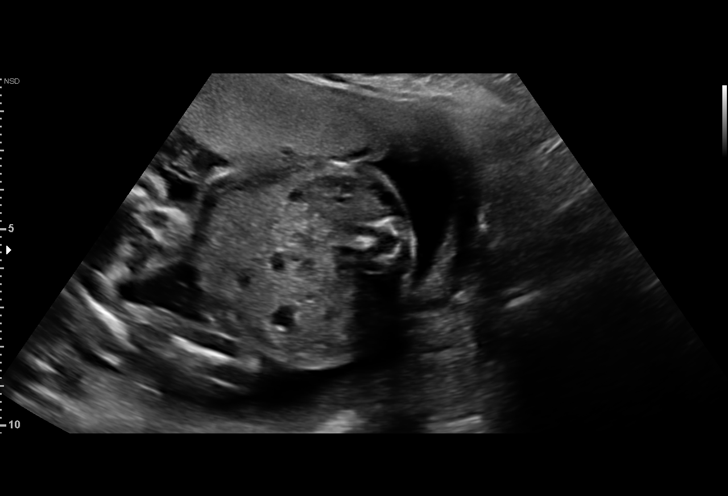
[im 20/42]
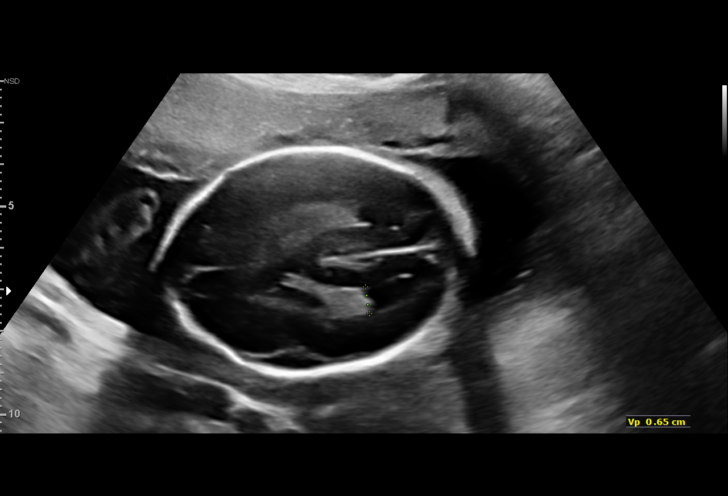
[im 23/42]
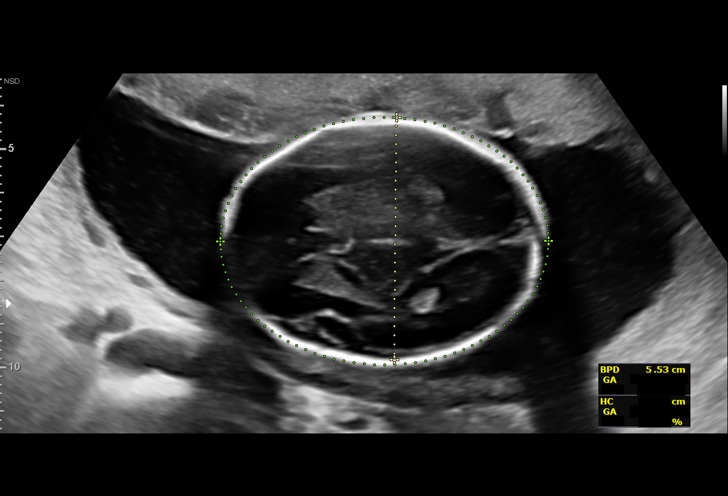
[im 26/42]
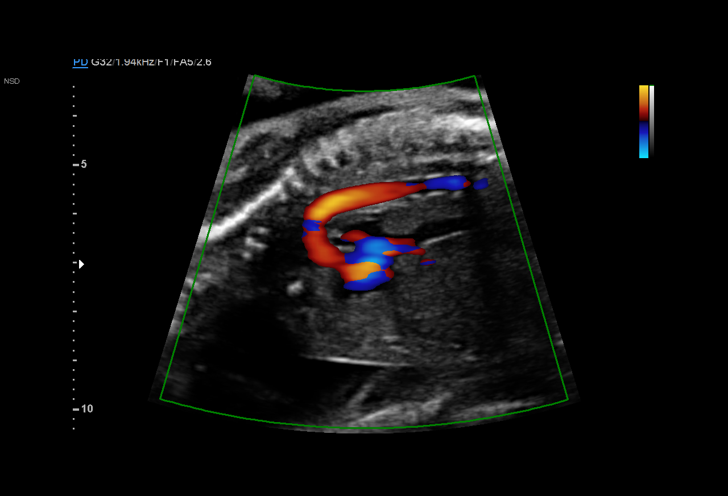
[im 29/42]
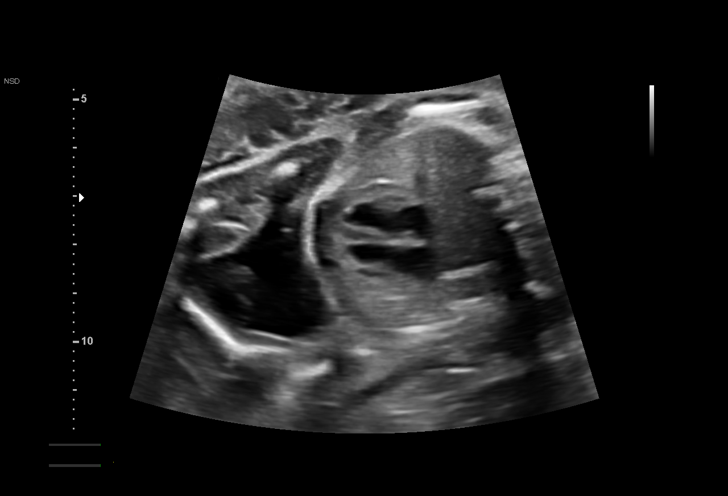
[im 32/42]
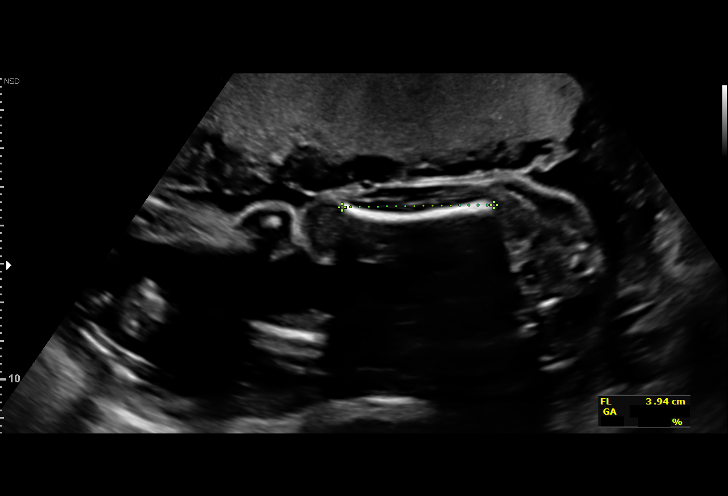
[im 35/42]
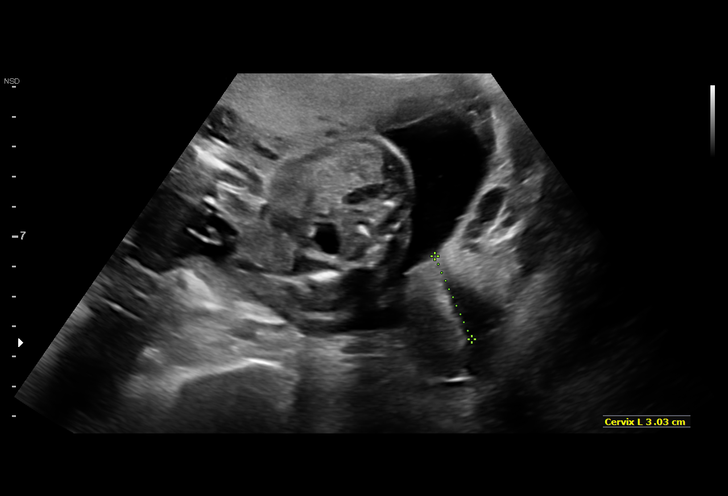
[im 38/42]
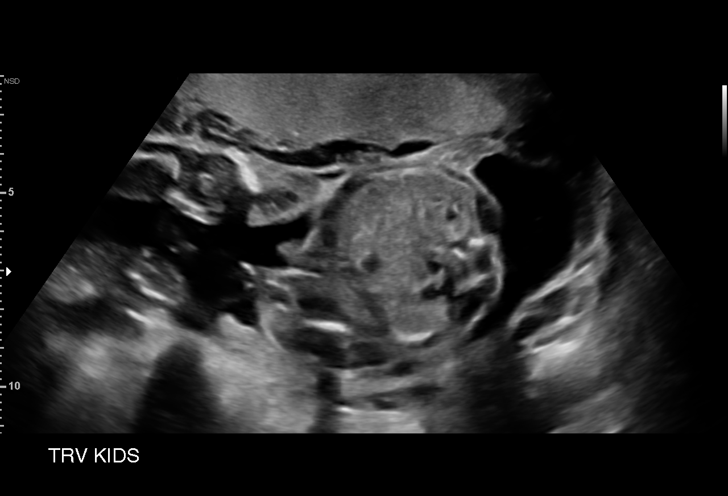
[im 42/42]
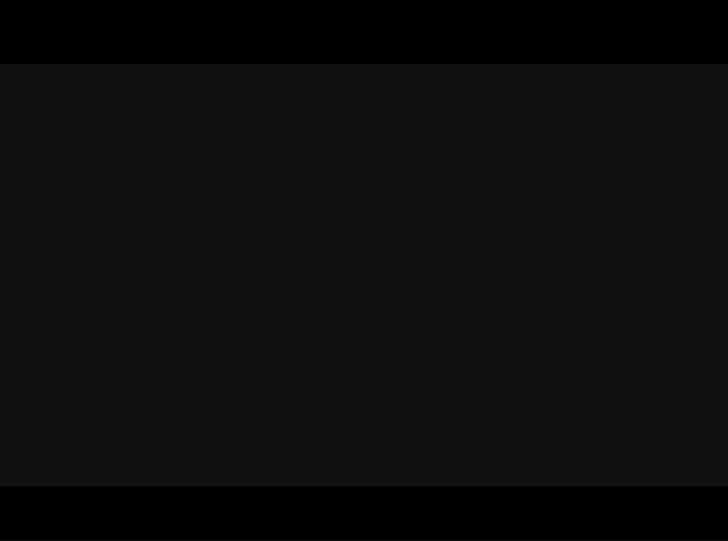

[14 of 28 positions shown; findings below may reference images not displayed]

Indications

23 weeks gestation of pregnancy
Smoking complicating pregnancy, second
trimester (0.5 pk/day)
Antenatal follow-up for nonvisualized fetal
anatomy
OB History

Blood Type:            Height:  5'2"   Weight (lb):  121       BMI:
Gravidity:    1
Fetal Evaluation

Num Of Fetuses:     1
Fetal Heart         138
Rate(bpm):
Cardiac Activity:   Observed
Presentation:       Breech
Placenta:           Anterior
P. Cord Insertion:  Previously Visualized

Amniotic Fluid
AFI FV:      Subjectively within normal limits

Largest Pocket(cm)
6.29
Biometry

BPD:      55.2  mm     G. Age:  22w 6d         38  %    CI:        70.61   %    70 - 86
FL/HC:      18.8   %    19.2 -
HC:      209.4  mm     G. Age:  23w 0d         37  %    HC/AC:      1.18        1.05 -
AC:      178.1  mm     G. Age:  22w 5d         32  %    FL/BPD:     71.2   %    71 - 87
FL:       39.3  mm     G. Age:  22w 4d         27  %    FL/AC:      22.1   %    20 - 24
Est. FW:     528  gm      1 lb 3 oz     49  %
Gestational Age

LMP:           23w 0d        Date:  02/28/17                 EDD:   12/05/17
U/S Today:     22w 6d                                        EDD:   12/06/17
Best:          23w 0d     Det. By:  LMP  (02/28/17)          EDD:   12/05/17
Anatomy

Cranium:               Appears normal         Aortic Arch:            Appears normal
Cavum:                 Previously seen        Ductal Arch:            Previously seen
Ventricles:            Appears normal         Diaphragm:              Appears normal
Choroid Plexus:        Previously seen        Stomach:                Appears normal, left
sided
Cerebellum:            Previously seen        Abdomen:                Appears normal
Posterior Fossa:       Previously seen        Abdominal Wall:         Previously seen
Nuchal Fold:           Previously seen        Cord Vessels:           Previously seen
Face:                  Orbits and profile     Kidneys:                Appear normal
previously seen
Lips:                  Previously seen        Bladder:                Appears normal
Thoracic:              Appears normal         Spine:                  Appears normal
Heart:                 Appears normal         Upper Extremities:      Previously seen
(4CH, axis, and situs
RVOT:                  Appears normal         Lower Extremities:      Previously seen
LVOT:                  Previously seen

Other:  Male gender previously seen. Heels, 5th digit, Open hands and Nasal
bone previously visualized. Technically difficult due to fetal position.
Cervix Uterus Adnexa

Cervix
Length:              3  cm.
Normal appearance by transabdominal scan.
Impression

Patient returned for completion of fetal anatomy. Amniotic
fluid is normal and good fetal activity is seen. Fetal biometry
is consistent with her previously-established dates.
Cardiac anatomy and fetal spine appear normal.
Recommendations

Follow-up scans as clinically indicated.

## 2019-11-11 ENCOUNTER — Encounter (HOSPITAL_COMMUNITY): Payer: Self-pay

## 2019-11-11 ENCOUNTER — Other Ambulatory Visit: Payer: Self-pay

## 2019-11-11 DIAGNOSIS — R519 Headache, unspecified: Secondary | ICD-10-CM | POA: Diagnosis present

## 2019-11-11 DIAGNOSIS — I10 Essential (primary) hypertension: Secondary | ICD-10-CM | POA: Diagnosis not present

## 2019-11-11 DIAGNOSIS — Z79899 Other long term (current) drug therapy: Secondary | ICD-10-CM | POA: Diagnosis not present

## 2019-11-11 DIAGNOSIS — E119 Type 2 diabetes mellitus without complications: Secondary | ICD-10-CM | POA: Insufficient documentation

## 2019-11-11 DIAGNOSIS — F17210 Nicotine dependence, cigarettes, uncomplicated: Secondary | ICD-10-CM | POA: Diagnosis not present

## 2019-11-11 DIAGNOSIS — F1099 Alcohol use, unspecified with unspecified alcohol-induced disorder: Secondary | ICD-10-CM | POA: Diagnosis not present

## 2019-11-11 NOTE — ED Triage Notes (Signed)
Pt arrives EMS with c/o headache. Per Ems pt had 3 shots of fireball pta.

## 2019-11-12 ENCOUNTER — Emergency Department (HOSPITAL_COMMUNITY)
Admission: EM | Admit: 2019-11-12 | Discharge: 2019-11-12 | Disposition: A | Discharge status: Home / Self Care | Payer: Medicaid Other | Attending: Emergency Medicine | Admitting: Emergency Medicine

## 2019-11-12 DIAGNOSIS — R519 Headache, unspecified: Secondary | ICD-10-CM

## 2019-11-12 LAB — I-STAT BETA HCG BLOOD, ED (MC, WL, AP ONLY): I-stat hCG, quantitative: <5 m[IU]/mL (ref <5)

## 2019-11-12 MED ORDER — ONDANSETRON HCL 4 MG PO TABS: 4.0000 mg | ORAL_TABLET | Freq: Three times a day (TID) | ORAL | 0 refills | Status: AC | PRN

## 2019-11-12 MED ORDER — KETOROLAC TROMETHAMINE 30 MG/ML IJ SOLN
30.0000 mg | Freq: Once | INTRAMUSCULAR | Status: AC
  Administered 2019-11-12: 30 mg via INTRAVENOUS
  Filled 2019-11-12: qty 1

## 2019-11-12 MED ORDER — SODIUM CHLORIDE 0.9 % IV BOLUS
1000.0000 mL | Freq: Once | INTRAVENOUS | Status: AC
  Administered 2019-11-12: 1000 mL via INTRAVENOUS

## 2019-11-12 MED ORDER — ACETAMINOPHEN 500 MG PO TABS: 500.0000 mg | ORAL_TABLET | Freq: Four times a day (QID) | ORAL | 0 refills | Status: AC | PRN

## 2019-11-12 MED ORDER — METOCLOPRAMIDE HCL 5 MG/ML IJ SOLN
10.0000 mg | Freq: Once | INTRAMUSCULAR | Status: AC
  Administered 2019-11-12: 10 mg via INTRAVENOUS
  Filled 2019-11-12: qty 2

## 2019-11-12 MED ORDER — DIPHENHYDRAMINE HCL 50 MG/ML IJ SOLN
25.0000 mg | Freq: Once | INTRAMUSCULAR | Status: AC
  Administered 2019-11-12: 25 mg via INTRAVENOUS
  Filled 2019-11-12: qty 1

## 2019-11-12 NOTE — ED Notes (Signed)
Pt went to restroom but did not obtain urine speciman

## 2019-11-12 NOTE — ED Provider Notes (Signed)
[facility]-EMERGENCY DEPT Provider Note   CSN: [id number] Arrival date & time: 11/11/19  2154     History Chief Complaint  Patient presents with  . Headache    Cassandra Marsh is a 34 y.o. female.  The history is provided by the patient. No language interpreter was used.  Headache    34 year old female significant history of hypertension, anxiety, diabetes brought here via EMS for evaluation of headache and alcohol use.  Patient reports she does have regular headaches.  Last night she report feeling very stressed out, did drink a couple shots of fireball liquor, and subsequently reported having a gradual onset of headache.  Headache is described as a throbbing sensation to mid forehead, persistent, throbbing, with associated light and sound sensitivity along with nausea.  Headache has intensified has not improved from this ER visit.  She also reported having increasing stress but denies SI or HI.  She denies fever chills runny nose sneezing coughing or Covid symptoms.  She did try taking some Tylenol at home with minimal relief.   Past Medical History:  Diagnosis Date  . Anxiety   . Diabetes mellitus without complication (HCC)   . Hypertension   . Mental disorder     Patient Active Problem List   Diagnosis Date Noted  . IUD (intrauterine device) in place 02/26/2018  . Abnormal glucose tolerance test (GTT) during pregnancy, antepartum 09/28/2017    Past Surgical History:  Procedure Laterality Date  . TONSILLECTOMY  2004     OB History    Gravida  1   Para  1   Term  1   Preterm  0   AB  0   Living  1     SAB  0   TAB  0   Ectopic  0   Multiple  0   Live Births  1           No family history on file.  Social History   Tobacco Use  . Smoking status: Current Every Day Smoker    Packs/day: 0.50    Types: Cigarettes  . Smokeless tobacco: Never Used  Vaping Use  . Vaping Use: Never used  Substance Use Topics  . Alcohol  use: Yes    Comment: occasional glass of wine throughout pregnancy  . Drug use: Yes    Types: Marijuana    Comment: occasional use throughout pregnancy    Home Medications Prior to Admission medications   Medication Sig Start Date End Date Taking? Authorizing Provider  acetaminophen (TYLENOL) 500 MG tablet Take 1,000 mg by mouth daily as needed for mild pain or headache.    [provider]  escitalopram (LEXAPRO) 10 MG tablet Take 1 tablet (10 mg total) by mouth daily. 09/20/18   Leftwich-Kirby, Wilmer Floor, CNM  ibuprofen (ADVIL,MOTRIN) 600 MG tablet Take 1 tablet (600 mg total) by mouth every 6 (six) hours. Patient not taking: Reported on 01/29/2018 11/17/17   Allayne Stack, DO  Multiple Vitamins-Minerals (MULTIVITAMIN ADULT PO) Take by mouth.    [provider]  NIFEdipine (ADALAT CC) 30 MG 24 hr tablet Take 1 tablet (30 mg total) by mouth daily. Patient not taking: Reported on 01/29/2018 11/17/17   Allayne Stack, DO  norgestimate-ethinyl estradiol (ORTHO-CYCLEN) 0.25-35 MG-MCG tablet Take 1 tablet by mouth daily. 09/09/18   Leftwich-Kirby, Wilmer Floor, CNM  Prenat-Fe Poly-Methfol-FA-DHA (VITAFOL ULTRA) 29-0.6-0.4-200 MG CAPS Take 1 capsule by mouth daily. Patient not taking: Reported on 01/29/2018 10/04/17  Brock Bad, MD  senna-docusate (SENOKOT-S) 8.6-50 MG tablet Take 2 tablets by mouth daily. Patient not taking: Reported on 01/29/2018 11/18/17   Allayne Stack, DO    Allergies    Patient has no known allergies.  Review of Systems   Review of Systems  Neurological: Positive for headaches.  All other systems reviewed and are negative.   Physical Exam Updated Vital Signs BP (!) 159/82 (BP Location: Left Arm)   Pulse 76   Temp 98.3 F (36.8 C) (Oral)   Resp 16   Ht 5\' 2"  (1.575 m)   Wt 65 kg   SpO2 97%   BMI 26.21 kg/m   Physical Exam Vitals and nursing note reviewed.  Constitutional:      General: She is not in acute distress.    Appearance:  She is well-developed.  HENT:     Head: Normocephalic and atraumatic.     Mouth/Throat:     Mouth: Mucous membranes are moist.  Eyes:     Conjunctiva/sclera: Conjunctivae normal.  Cardiovascular:     Rate and Rhythm: Normal rate and regular rhythm.     Heart sounds: Normal heart sounds.  Pulmonary:     Effort: Pulmonary effort is normal.     Breath sounds: Normal breath sounds.  Abdominal:     General: Bowel sounds are normal.     Palpations: Abdomen is soft.     Tenderness: There is no abdominal tenderness.  Musculoskeletal:        General: Normal range of motion.     Cervical back: Normal range of motion and neck supple. No rigidity.  Skin:    General: Skin is warm.     Findings: No rash.  Neurological:     Mental Status: She is alert and oriented to person, place, and time.     GCS: GCS eye subscore is 4. GCS verbal subscore is 5. GCS motor subscore is 6.     Cranial Nerves: No cranial nerve deficit.     Sensory: No sensory deficit.  Psychiatric:        Mood and Affect: Mood normal.     ED Results / Procedures / Treatments   Labs (all labs ordered are listed, but only abnormal results are displayed) Labs Reviewed  I-STAT BETA HCG BLOOD, ED ([facility], [facility], [facility] ONLY)    EKG None  Radiology No results found.  Procedures Procedures (including critical care time)  Medications Ordered in ED Medications  sodium chloride 0.9 % bolus 1,000 mL (0 mLs Intravenous Stopped 11/12/19 0907)  ketorolac (TORADOL) 30 MG/ML injection 30 mg (30 mg Intravenous Given 11/12/19 0904)  diphenhydrAMINE (BENADRYL) injection 25 mg (25 mg Intravenous Given 11/12/19 0904)  metoCLOPramide (REGLAN) injection 10 mg (10 mg Intravenous Given 11/12/19 13/3/21)    ED Course  I have reviewed the triage vital signs and the nursing notes.  Pertinent labs & imaging results that were available during my care of the patient were reviewed by me and considered in my medical decision making (see chart for  details).    MDM Rules/Calculators/A&P                          11:19 AM BP 115/75 (BP Location: Left Arm)   Pulse 83   Temp 98.3 F (36.8 C) (Oral)   Resp 15   Ht 5\' 2"  (1.575 m)   Wt 65 kg   SpO2 94%   BMI 26.21 kg/m  Final Clinical Impression(s) / ED Diagnoses Final diagnoses:  Recurrent headache    Rx / DC Orders ED Discharge Orders         Ordered    acetaminophen (TYLENOL) 500 MG tablet  Every 6 hours PRN        11/12/19 1120    ondansetron (ZOFRAN) 4 MG tablet  Every 8 hours PRN        11/12/19 1120         8:03 AM Patient here with acute on chronic recurrent headache brought on by stress anxiety as well as alcohol use.  No red flags.  Will give migraine cocktail.  11:19 AM Patient received migraine cocktail and rest comfortably, she report her headache is much improved.  At this time she is stable for discharge.  Return precautions discussed.   Fayrene Helper, PA-C 11/12/19 1121    Pricilla Loveless, MD 11/12/19 1529

## 2020-03-12 ENCOUNTER — Ambulatory Visit
Admission: RE | Admit: 2020-03-12 | Discharge: 2020-03-12 | Disposition: A | Discharge status: Home / Self Care | Payer: Medicaid Other | Source: Ambulatory Visit | Attending: Family Medicine | Admitting: Family Medicine

## 2020-03-12 ENCOUNTER — Other Ambulatory Visit: Payer: Self-pay | Admitting: Family Medicine

## 2020-03-12 ENCOUNTER — Other Ambulatory Visit: Payer: Self-pay

## 2020-03-12 DIAGNOSIS — R059 Cough, unspecified: Secondary | ICD-10-CM

## 2020-06-18 IMAGING — CR RIGHT ANKLE - 2 VIEW
2 series · 2 of 2 positions shown · non-contrast
Comparison: None.

CLINICAL DATA: The patient suffered a left ankle injury when she
stepped in a hole today. Pain. Initial encounter.

EXAM:
RIGHT ANKLE - 2 VIEW

[x ankle ap right]
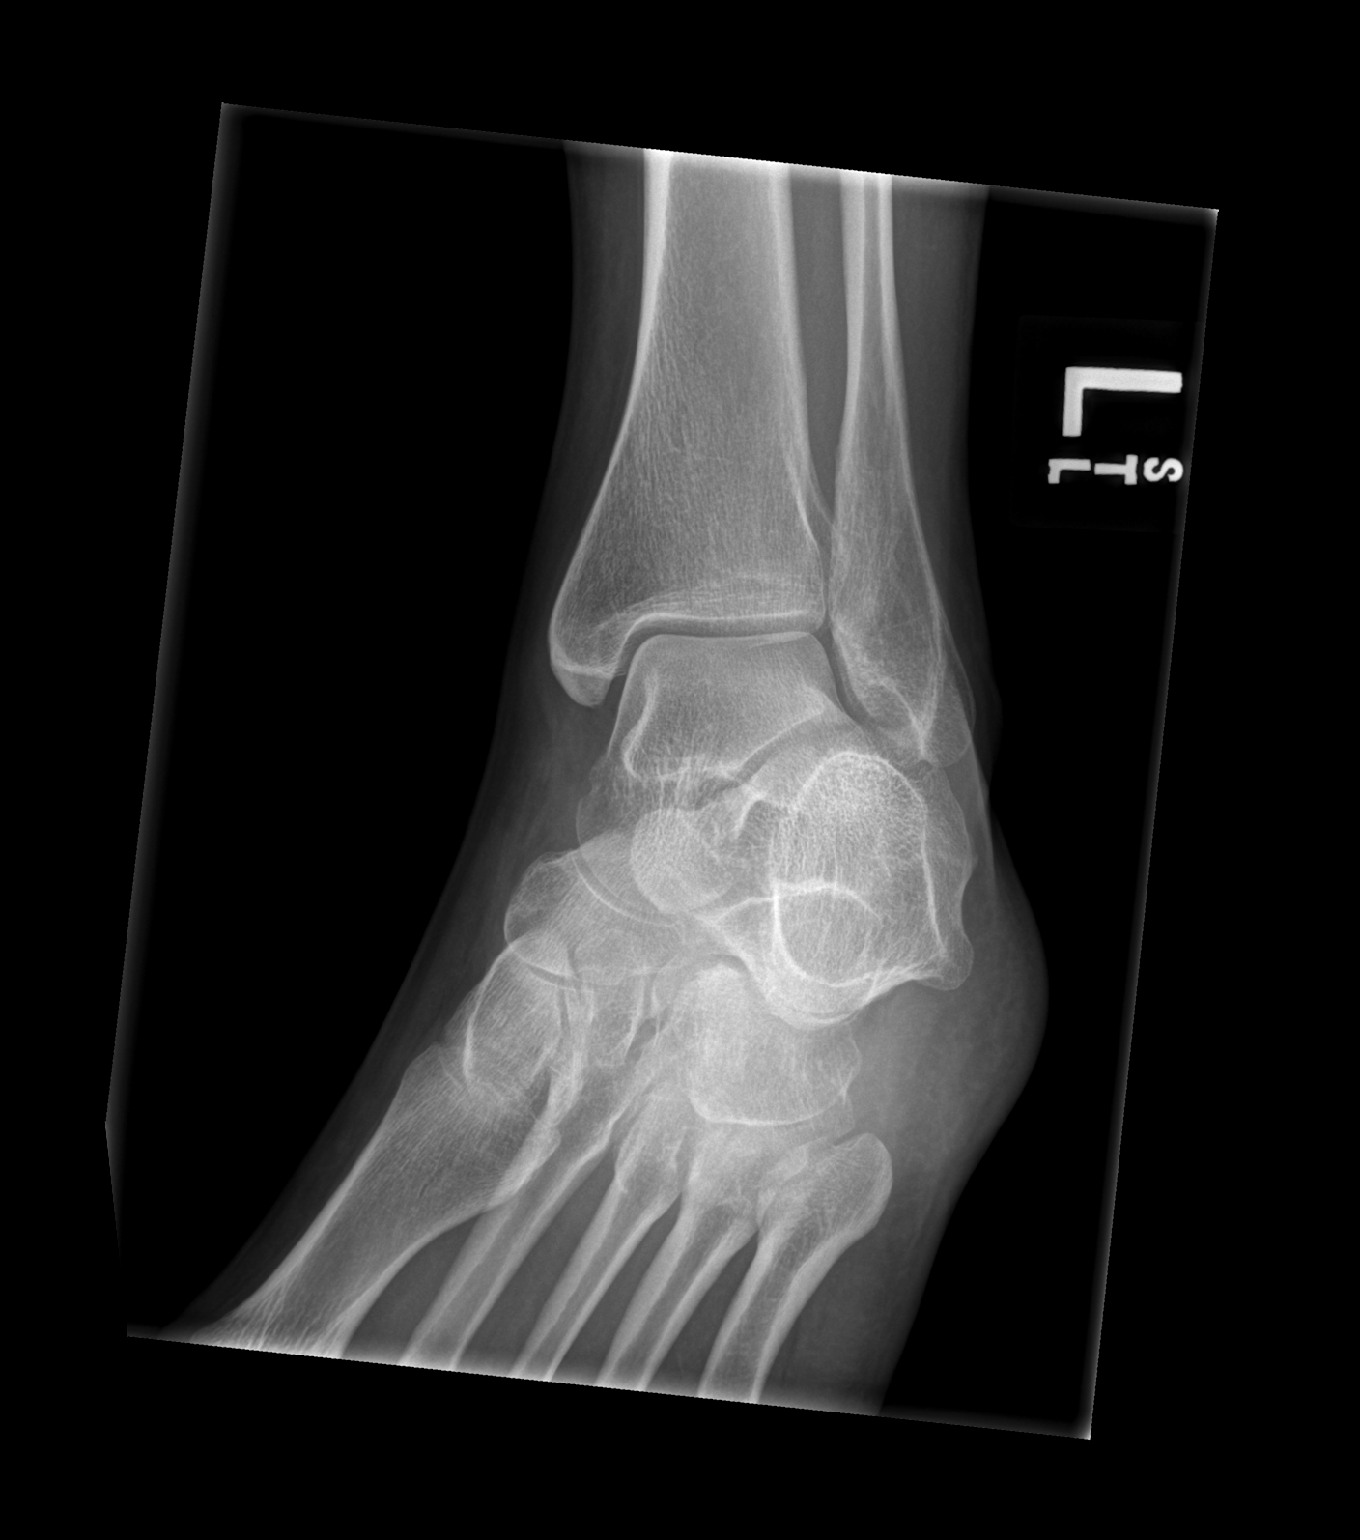

[x ankle lat right]
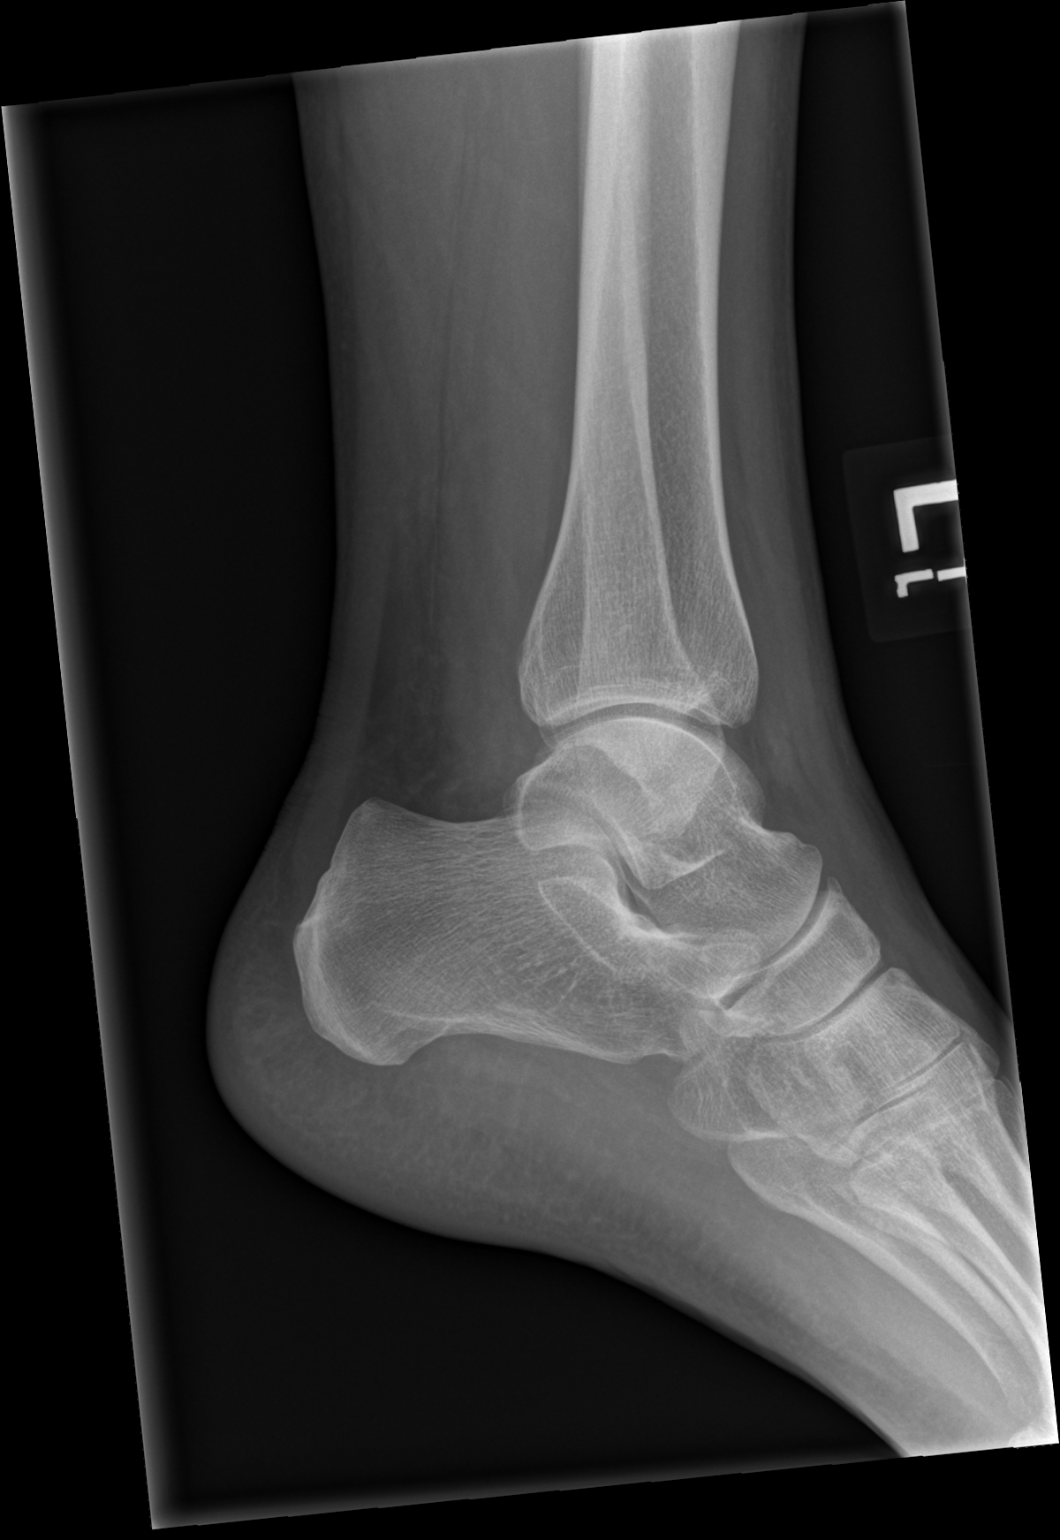

[2 of 2 positions shown; findings below may reference images not displayed]

FINDINGS: There is no evidence of fracture, dislocation, or joint effusion.
There is no evidence of arthropathy or other focal bone abnormality.
Soft tissues are unremarkable.
IMPRESSION: Negative exam.

## 2021-11-30 DIAGNOSIS — K219 Gastro-esophageal reflux disease without esophagitis: Secondary | ICD-10-CM | POA: Insufficient documentation

## 2021-11-30 DIAGNOSIS — F419 Anxiety disorder, unspecified: Secondary | ICD-10-CM | POA: Insufficient documentation

## 2021-11-30 DIAGNOSIS — E119 Type 2 diabetes mellitus without complications: Secondary | ICD-10-CM | POA: Insufficient documentation

## 2021-11-30 DIAGNOSIS — I10 Essential (primary) hypertension: Secondary | ICD-10-CM | POA: Insufficient documentation

## 2021-11-30 DIAGNOSIS — F99 Mental disorder, not otherwise specified: Secondary | ICD-10-CM | POA: Insufficient documentation

## 2021-11-30 DIAGNOSIS — R5383 Other fatigue: Secondary | ICD-10-CM | POA: Insufficient documentation

## 2021-11-30 DIAGNOSIS — R0789 Other chest pain: Secondary | ICD-10-CM | POA: Insufficient documentation

## 2021-12-05 ENCOUNTER — Ambulatory Visit: Payer: Medicaid Other | Admitting: Cardiology

## 2021-12-21 ENCOUNTER — Ambulatory Visit: Payer: Medicaid Other | Admitting: Cardiology

## 2022-01-16 ENCOUNTER — Ambulatory Visit: Payer: Medicaid Other | Attending: Cardiology | Admitting: Cardiology

## 2022-01-16 ENCOUNTER — Encounter: Payer: Self-pay | Admitting: Cardiology

## 2022-01-16 VITALS — BP 110/70 | HR 86 | Ht 62.0 in | Wt 138.0 lb

## 2022-01-16 DIAGNOSIS — F17200 Nicotine dependence, unspecified, uncomplicated: Secondary | ICD-10-CM | POA: Diagnosis not present

## 2022-01-16 DIAGNOSIS — I10 Essential (primary) hypertension: Secondary | ICD-10-CM | POA: Diagnosis not present

## 2022-01-16 DIAGNOSIS — R0789 Other chest pain: Secondary | ICD-10-CM | POA: Diagnosis not present

## 2022-01-16 DIAGNOSIS — E119 Type 2 diabetes mellitus without complications: Secondary | ICD-10-CM

## 2022-01-16 DIAGNOSIS — Z8249 Family history of ischemic heart disease and other diseases of the circulatory system: Secondary | ICD-10-CM | POA: Insufficient documentation

## 2022-01-16 MED ORDER — IVABRADINE HCL 5 MG PO TABS: 10.0000 mg | ORAL_TABLET | Freq: Once | ORAL | 0 refills | Status: AC

## 2022-01-16 MED ORDER — ASPIRIN 81 MG PO TBEC: 81.0000 mg | DELAYED_RELEASE_TABLET | Freq: Every day | ORAL | 3 refills | Status: AC

## 2022-01-16 MED ORDER — METOPROLOL TARTRATE 50 MG PO TABS: ORAL_TABLET | ORAL | 0 refills | Status: DC

## 2022-01-16 NOTE — Patient Instructions (Signed)
Medication Instructions:   START: Enteric Coated 81mg  Aspirin  TAKE: Ivabradine 10mg  and Metoprolol 50mg  2 hours prior to CT Scan  *If you need a refill on your cardiac medications before your next appointment, please call your pharmacy*   Lab Work: Lipid, AST, ALT- today If you have labs (blood work) drawn today and your tests are completely normal, you will receive your results only by: MyChart Message (if you have MyChart) OR A paper copy in the mail If you have any lab test that is abnormal or we need to change your treatment, we will call you to review the results.   Testing/Procedures: Your physician has requested that you have cardiac CT. Cardiac computed tomography (CT) is a painless test that uses an x-ray machine to take clear, detailed pictures of your heart. For further information please visit . Please follow instruction sheet as given.    Your Cardiac CT will be scheduled at:   [facility] located off [facility] at the hospital.  Please arrive 30 minutes prior to your appointment time.  You can use the FREE valet parking offered at entrance to outpatient center (encouraged to control the heart rate for the test)   Please follow these instructions carefully (unless otherwise directed):    On the Night Before the Test: Be sure to Drink plenty of water. Do not consume any caffeinated/decaffeinated beverages or chocolate 12 hours prior to your test. Do not take any antihistamines 12 hours prior to your test.   On the Day of the Test: Drink plenty of water until 1 hour prior to the test. Do not eat any food 4 hours prior to the test. No smoking 4 hours prior to test. You may take your regular medications prior to the test.  Take metoprolol (Lopressor) two hours prior to test. HOLD Furosemide/Hydrochlorothiazide morning of the test. FEMALES- please wear underwire-free bra if available, avoid dresses & tight  clothing. Wear plain shirt no beads, sparkles, rhinestones, metal or heavy embroidery.  After the Test: Drink plenty of water. After receiving IV contrast, you may experience a mild flushed feeling. This is normal. On occasion, you may experience a mild rash up to 24 hours after the test. This is not dangerous. If this occurs, you can take Benadryl 25 mg and increase your fluid intake. If you experience trouble breathing, this can be serious. If it is severe call 911 IMMEDIATELY. If it is mild, please call our office. If you take any of these medications: Glipizide/Metformin, Avandament, Glucavance, please do not take 48 hours after completing test unless otherwise instructed.  We will call to schedule your test 2-4 weeks out understanding that some insurance companies will need an authorization prior to the service being performed.      Follow-Up: At [facility], you and your health needs are our priority.  As part of our continuing mission to provide you with exceptional heart care, we have created designated Provider Care Teams.  These Care Teams include your primary Cardiologist (physician) and Advanced Practice Providers (APPs -  Physician Assistants and Nurse Practitioners) who all work together to provide you with the care you need, when you need it.  We recommend signing up for the patient portal called "MyChart".  Sign up information is provided on this After Visit Summary.  MyChart is used to connect with patients for Virtual Visits (Telemedicine).  Patients are able to view lab/test results, encounter notes, upcoming appointments, etc.  Non-urgent messages can be sent  to your provider as well.   To learn more about what you can do with MyChart, go to [email or web address].    Your next appointment:   2 month(s)  The format for your next appointment:   In Person  Provider:   Gypsy Balsam, MD    Other Instructions Cardiac CT Angiogram A cardiac CT angiogram is  a procedure to look at the heart and the area around the heart. It may be done to help find the cause of chest pains or other symptoms of heart disease. During this procedure, a substance called contrast dye is injected into the blood vessels in the area to be checked. A large X-ray machine, called a CT scanner, then takes detailed pictures of the heart and the surrounding area. The procedure is also sometimes called a coronary CT angiogram, coronary artery scanning, or CTA. A cardiac CT angiogram allows the health care provider to see how well blood is flowing to and from the heart. The health care provider will be able to see if there are any problems, such as: Blockage or narrowing of the coronary arteries in the heart. Fluid around the heart. Signs of weakness or disease in the muscles, valves, and tissues of the heart. Tell a health care provider about: Any allergies you have. This is especially important if you have had a previous allergic reaction to contrast dye. All medicines you are taking, including vitamins, herbs, eye drops, creams, and over-the-counter medicines. Any blood disorders you have. Any surgeries you have had. Any medical conditions you have. Whether you are pregnant or may be pregnant. Any anxiety disorders, chronic pain, or other conditions you have that may increase your stress or prevent you from lying still. What are the risks? Generally, this is a safe procedure. However, problems may occur, including: Bleeding. Infection. Allergic reactions to medicines or dyes. Damage to other structures or organs. Kidney damage from the contrast dye that is used. Increased risk of cancer from radiation exposure. This risk is low. Talk with your health care provider about: The risks and benefits of testing. How you can receive the lowest dose of radiation. What happens before the procedure? Wear comfortable clothing and remove any jewelry, glasses, dentures, and hearing  aids. Follow instructions from your health care provider about eating and drinking. This may include: For 12 hours before the procedure -- avoid caffeine. This includes tea, coffee, soda, energy drinks, and diet pills. Drink plenty of water or other fluids that do not have caffeine in them. Being well hydrated can prevent complications. For 4-6 hours before the procedure -- stop eating and drinking. The contrast dye can cause nausea, but this is less likely if your stomach is empty. Ask your health care provider about changing or stopping your regular medicines. This is especially important if you are taking diabetes medicines, blood thinners, or medicines to treat problems with erections (erectile dysfunction). What happens during the procedure?  Hair on your chest may need to be removed so that small sticky patches called electrodes can be placed on your chest. These will transmit information that helps to monitor your heart during the procedure. An IV will be inserted into one of your veins. You might be given a medicine to control your heart rate during the procedure. This will help to ensure that good images are obtained. You will be asked to lie on an exam table. This table will slide in and out of the CT machine during the procedure. Contrast dye will be  injected into the IV. You might feel warm, or you may get a metallic taste in your mouth. You will be given a medicine called nitroglycerin. This will relax or dilate the arteries in your heart. The table that you are lying on will move into the CT machine tunnel for the scan. The person running the machine will give you instructions while the scans are being done. You may be asked to: Keep your arms above your head. Hold your breath. Stay very still, even if the table is moving. When the scanning is complete, you will be moved out of the machine. The IV will be removed. The procedure may vary among health care providers and  hospitals. What can I expect after the procedure? After your procedure, it is common to have: A metallic taste in your mouth from the contrast dye. A feeling of warmth. A headache from the nitroglycerin. Follow these instructions at home: Take over-the-counter and prescription medicines only as told by your health care provider. If you are told, drink enough fluid to keep your urine pale yellow. This will help to flush the contrast dye out of your body. Most people can return to their normal activities right after the procedure. Ask your health care provider what activities are safe for you. It is up to you to get the results of your procedure. Ask your health care provider, or the department that is doing the procedure, when your results will be ready. Keep all follow-up visits as told by your health care provider. This is important. Contact a health care provider if: You have any symptoms of allergy to the contrast dye. These include: Shortness of breath. Rash or hives. A racing heartbeat. Summary A cardiac CT angiogram is a procedure to look at the heart and the area around the heart. It may be done to help find the cause of chest pains or other symptoms of heart disease. During this procedure, a large X-ray machine, called a CT scanner, takes detailed pictures of the heart and the surrounding area after a contrast dye has been injected into blood vessels in the area. Ask your health care provider about changing or stopping your regular medicines before the procedure. This is especially important if you are taking diabetes medicines, blood thinners, or medicines to treat erectile dysfunction. If you are told, drink enough fluid to keep your urine pale yellow. This will help to flush the contrast dye out of your body. This information is not intended to replace advice given to you by your health care provider. Make sure you discuss any questions you have with your health care  provider. Document Revised: 04/14/2021 Document Reviewed: 08/21/2018 Elsevier Patient Education  [place]

## 2022-01-16 NOTE — Progress Notes (Signed)
Cardiology Consultation:    Date:  01/16/2022   ID:  Cassandra Marsh, DOB 1985-01-28, MRN [id number]  PCP:  [facility]  Cardiologist:  Jenne Campus, MD   Referring MD: Ferd Hibbs, NP   Chief Complaint  Patient presents with   Chest Pain    History of Present Illness:    Cassandra Marsh is a 37 y.o. female who is being seen today for the evaluation of chest pain at the request of Ferd Hibbs, NP.  Past medical history significant for hypertension while she was pregnant, prediabetic, chronic active smoking, family history of premature coronary artery disease, her mother had massive myocardial infarction when she was 11.  She was referred to [place] for evaluation of chest pain.  She described chest pain as pinches needled and happening in different situations sometimes when she is sitting sometimes when she is walking.  She works physically and sometimes when she walks she will Get tired also developed some shortness of breath as well as uneasy sensation in the chest.  There is some sweating associated with this but no palpitations no dizziness.  There is no swelling of lower extremities.  She smokes about 1 pack/day she has been doing this for many years.  Years ago she was able to switch to vapes but did not like it and went back to smoking again.  She does not exercise on the regular basis she is not on a special diet.  She got 53 child 30 years old  Past Medical History:  Diagnosis Date   Abnormal glucose tolerance test (GTT) during pregnancy, antepartum 09/28/2017   Follow up 2 hour postpartum on 02/26/18   Anxiety    Chest pressure    Diabetes mellitus without complication (HCC)    Fatigue    GERD (gastroesophageal reflux disease)    Hypertension    IUD (intrauterine device) in place 02/26/2018   Mental disorder     Past Surgical History:  Procedure Laterality Date   TONSILLECTOMY  2004    Current Medications: Current Meds  Medication Sig    acetaminophen (TYLENOL) 500 MG tablet Take 1 tablet (500 mg total) by mouth every 6 (six) hours as needed.   ARIPiprazole (ABILIFY) 5 MG tablet Take 5 mg by mouth daily.   cyclobenzaprine (FLEXERIL) 5 MG tablet Take 5 mg by mouth at bedtime as needed for muscle pain.   escitalopram (LEXAPRO) 10 MG tablet Take 1 tablet (10 mg total) by mouth daily.   lamoTRIgine (LAMICTAL) 200 MG tablet Take 400 mg by mouth 2 (two) times daily.   naproxen (NAPROSYN) 500 MG tablet Take 500 mg by mouth 2 (two) times daily as needed for pain.   norethindrone (MICRONOR) 0.35 MG tablet Take 1 tablet by mouth daily.   ondansetron (ZOFRAN) 4 MG tablet Take 1 tablet (4 mg total) by mouth every 8 (eight) hours as needed for nausea or vomiting.   traZODone (DESYREL) 150 MG tablet Take 150 mg by mouth at bedtime.     Allergies:   Patient has no known allergies.   Social History   Socioeconomic History   Marital status: Legally Separated    Spouse name: Not on file   Number of children: Not on file   Years of education: Not on file   Highest education level: Not on file  Occupational History   Not on file  Tobacco Use   Smoking status: Every Day    Packs/day: 0.50    Types: Cigarettes  Smokeless tobacco: Never  Vaping Use   Vaping Use: Never used  Substance and Sexual Activity   Alcohol use: Yes    Comment: occasional glass of wine throughout pregnancy   Drug use: Yes    Types: Marijuana    Comment: occasional use throughout pregnancy   Sexual activity: Yes    Partners: Male    Birth control/protection: I.U.D.  Other Topics Concern   Not on file  Social History Narrative   Not on file   Social Determinants of Health   Financial Resource Strain: Not on file  Food Insecurity: Not on file  Transportation Needs: Not on file  Physical Activity: Not on file  Stress: Not on file  Social Connections: Not on file     Family History: The patient's family history includes Heart attack in her maternal  grandfather and mother. ROS:   Please see the history of present illness.    All 14 point review of systems negative except as described per history of present illness.  EKGs/Labs/Other Studies Reviewed:    The following studies were reviewed today:   EKG:  EKG is  ordered today.  The ekg ordered today demonstrates normal sinus rhythm, rate 86, normal P interval, normal QS complex duration morphology  Recent Labs: No results found for requested labs within last 365 days.  Recent Lipid Panel No results found for: "CHOL", "TRIG", "HDL", "CHOLHDL", "VLDL", "LDLCALC", "LDLDIRECT"  Physical Exam:    VS:  BP 110/70 (BP Location: Right Arm, Patient Position: Sitting, Cuff Size: Normal)   Pulse 86   Ht 5\' 2"  (1.575 m)   Wt 138 lb (62.6 kg)   SpO2 95%   BMI 25.24 kg/m     Wt Readings from Last 3 Encounters:  01/16/22 138 lb (62.6 kg)  11/11/19 143 lb 4.8 oz (65 kg)  09/09/18 138 lb (62.6 kg)     GEN:  Well nourished, well developed in no acute distress HEENT: Normal NECK: No JVD; No carotid bruits LYMPHATICS: No lymphadenopathy CARDIAC: RRR, no murmurs, no rubs, no gallops RESPIRATORY:  Clear to auscultation without rales, wheezing or rhonchi  ABDOMEN: Soft, non-tender, non-distended MUSCULOSKELETAL:  No edema; No deformity  SKIN: Warm and dry NEUROLOGIC:  Alert and oriented x 3 PSYCHIATRIC:  Normal affect   ASSESSMENT:    1. Atypical chest pain   2. Diabetes mellitus without complication (HCC)   3. Primary hypertension   4. Smoking   5. Family history of premature coronary artery disease    PLAN:    In order of problems listed above:  Atypical chest pain but this lady have multiple risk factors for coronary artery disease, namely she smokes she does have borderline diabetes, cholesterol status unknown.  She does have family history of premature coronary artery disease, her mother got myocardial infarction at the age of 40 which is her age.  I think we need to evaluate  for presence of coronary artery disease I think the best option will be to perform coronary CT angio.  I will schedule her to have the test.  I will ask her to have aspirin 81 daily Diabetes.  She was still on the stage of dieting and weight loss.  Continue monitoring Smoking obviously huge problem I spent at least 5 minutes talking about techniques that she can use to help quitting smoking.  She understand that this is important she is feeling guilty about infection smoking hopefully she will be able to quit.  She already purchased nicotine  patches which should help. Essential hyper engine blood pressure well-controlled continue present management. Family history of premature coronary artery disease.  Noted   Medication Adjustments/Labs and Tests Ordered: Current medicines are reviewed at length with the patient today.  Concerns regarding medicines are outlined above.  No orders of the defined types were placed in this encounter.  No orders of the defined types were placed in this encounter.   Signed, Georgeanna Lea, MD, [facility]. 01/16/2022 4:16 PM    [facility]

## 2022-01-16 NOTE — Addendum Note (Signed)
Addended by: Jacobo Forest D on: 01/16/2022 04:38 PM   Modules accepted: Orders

## 2022-01-17 LAB — LIPID PANEL
Chol/HDL Ratio: 4.1 ratio (ref 0.0–4.4)
Cholesterol, Total: 126 mg/dL (ref 100–199)
HDL: 31 mg/dL — ABNORMAL LOW (ref >39)
LDL Chol Calc (NIH): 77 mg/dL (ref 0–99)
Triglycerides: 91 mg/dL (ref 0–149)
VLDL Cholesterol Cal: 18 mg/dL (ref 5–40)

## 2022-01-17 LAB — AST: AST: 18 IU/L (ref 0–40)

## 2022-01-17 LAB — ALT: ALT: 21 IU/L (ref 0–32)

## 2022-01-19 ENCOUNTER — Telehealth: Payer: Self-pay

## 2022-01-19 NOTE — Telephone Encounter (Signed)
Patient returned RN's call. 

## 2022-01-19 NOTE — Telephone Encounter (Signed)
-----   Message from Park Liter, MD sent at 01/19/2022 12:16 PM EST ----- Cholesterol looks good, liver function test looking good, continue present management

## 2022-01-19 NOTE — Telephone Encounter (Signed)
Patient informed of results through my chart.  

## 2022-01-27 ENCOUNTER — Encounter: Payer: Self-pay | Admitting: Cardiology

## 2022-01-31 ENCOUNTER — Telehealth: Payer: Self-pay

## 2022-01-31 NOTE — Telephone Encounter (Signed)
Results reviewed with pt as per Dr. Krasowski's note.  Pt verbalized understanding and had no additional questions. Routed to PCP  

## 2022-03-27 ENCOUNTER — Encounter: Payer: Self-pay | Admitting: Cardiology

## 2022-03-27 ENCOUNTER — Ambulatory Visit: Payer: Medicaid Other | Attending: Cardiology | Admitting: Cardiology

## 2022-03-27 ENCOUNTER — Telehealth: Payer: Self-pay | Admitting: Cardiology

## 2022-03-27 VITALS — BP 118/64 | HR 62 | Ht 62.0 in | Wt 139.0 lb

## 2022-03-27 DIAGNOSIS — E119 Type 2 diabetes mellitus without complications: Secondary | ICD-10-CM

## 2022-03-27 DIAGNOSIS — I2510 Atherosclerotic heart disease of native coronary artery without angina pectoris: Secondary | ICD-10-CM | POA: Insufficient documentation

## 2022-03-27 DIAGNOSIS — F17200 Nicotine dependence, unspecified, uncomplicated: Secondary | ICD-10-CM

## 2022-03-27 DIAGNOSIS — E785 Hyperlipidemia, unspecified: Secondary | ICD-10-CM | POA: Diagnosis not present

## 2022-03-27 DIAGNOSIS — R0789 Other chest pain: Secondary | ICD-10-CM

## 2022-03-27 MED ORDER — PRAVASTATIN SODIUM 20 MG PO TABS: 20.0000 mg | ORAL_TABLET | Freq: Every evening | ORAL | 3 refills | Status: AC

## 2022-03-27 NOTE — Progress Notes (Signed)
Cardiology Office Note:    Date:  03/27/2022   ID:  Cassandra Marsh, DOB 09/14/1985, MRN [id number]  PCP:  [facility]  Cardiologist:  Jenne Campus, MD    Referring MD: [facility]*   Chief Complaint  Patient presents with   Follow-up  To discuss results of the test  History of Present Illness:    Cassandra Marsh is a 37 y.o. female past medical history significant for diabetes, dyslipidemia, smoking, family history of premature coronary artery disease, she presented to me with atypical symptoms.  Coronary CT angio has been performed which showed mild disease involving mid LAD between 25 and 49% stenosis.  Comes today to my office for follow-up.  Overall doing quite well.  Still described to have some atypical chest pain lasting only for split-second to few seconds on the left side of her chest not related to exercise.  Still working hard 73 continues to smoke  Past Medical History:  Diagnosis Date   Abnormal glucose tolerance test (GTT) during pregnancy, antepartum 09/28/2017   Follow up 2 hour postpartum on 02/26/18   Anxiety    Chest pressure    Diabetes mellitus without complication (HCC)    Fatigue    GERD (gastroesophageal reflux disease)    Hypertension    IUD (intrauterine device) in place 02/26/2018   Mental disorder     Past Surgical History:  Procedure Laterality Date   TONSILLECTOMY  2004    Current Medications: Current Meds  Medication Sig   acetaminophen (TYLENOL) 500 MG tablet Take 1 tablet (500 mg total) by mouth every 6 (six) hours as needed.   ARIPiprazole (ABILIFY) 5 MG tablet Take 5 mg by mouth daily.   aspirin EC 81 MG tablet Take 1 tablet (81 mg total) by mouth daily. Swallow whole.   cyclobenzaprine (FLEXERIL) 5 MG tablet Take 5 mg by mouth at bedtime as needed for muscle pain.   escitalopram (LEXAPRO) 10 MG tablet Take 1 tablet (10 mg total) by mouth daily.   lamoTRIgine (LAMICTAL) 200 MG tablet Take 400 mg by  mouth 2 (two) times daily.   metoprolol tartrate (LOPRESSOR) 50 MG tablet Take one tablet 2 hours before cardiac CT for heart greater than 55   naproxen (NAPROSYN) 500 MG tablet Take 500 mg by mouth 2 (two) times daily as needed for pain.   norethindrone (MICRONOR) 0.35 MG tablet Take 1 tablet by mouth daily.   ondansetron (ZOFRAN) 4 MG tablet Take 1 tablet (4 mg total) by mouth every 8 (eight) hours as needed for nausea or vomiting.   traZODone (DESYREL) 150 MG tablet Take 150 mg by mouth at bedtime.     Allergies:   Patient has no known allergies.   Social History   Socioeconomic History   Marital status: Legally Separated    Spouse name: Not on file   Number of children: Not on file   Years of education: Not on file   Highest education level: Not on file  Occupational History   Not on file  Tobacco Use   Smoking status: Every Day    Packs/day: .5    Types: Cigarettes   Smokeless tobacco: Never  Vaping Use   Vaping Use: Never used  Substance and Sexual Activity   Alcohol use: Yes    Comment: occasional glass of wine throughout pregnancy   Drug use: Yes    Types: Marijuana    Comment: occasional use throughout pregnancy   Sexual activity: Yes  Partners: Male    Birth control/protection: I.U.D.  Other Topics Concern   Not on file  Social History Narrative   Not on file   Social Determinants of Health   Financial Resource Strain: Not on file  Food Insecurity: Not on file  Transportation Needs: Not on file  Physical Activity: Not on file  Stress: Not on file  Social Connections: Not on file     Family History: The patient's family history includes Heart attack in her maternal grandfather and mother. ROS:   Please see the history of present illness.    All 14 point review of systems negative except as described per history of present illness  EKGs/Labs/Other Studies Reviewed:      Recent Labs: 01/16/2022: ALT 21  Recent Lipid Panel    Component Value  Date/Time   CHOL 126 01/16/2022 1640   TRIG 91 01/16/2022 1640   HDL 31 (L) 01/16/2022 1640   CHOLHDL 4.1 01/16/2022 1640   LDLCALC 77 01/16/2022 1640    Physical Exam:    VS:  BP 118/64 (BP Location: Left Arm, Patient Position: Sitting)   Pulse 62   Ht 5\' 2"  (1.575 m)   Wt 139 lb (63 kg)   SpO2 96%   BMI 25.42 kg/m     Wt Readings from Last 3 Encounters:  03/27/22 139 lb (63 kg)  01/16/22 138 lb (62.6 kg)  11/11/19 143 lb 4.8 oz (65 kg)     GEN:  Well nourished, well developed in no acute distress HEENT: Normal NECK: No JVD; No carotid bruits LYMPHATICS: No lymphadenopathy CARDIAC: RRR, no murmurs, no rubs, no gallops RESPIRATORY:  Clear to auscultation without rales, wheezing or rhonchi  ABDOMEN: Soft, non-tender, non-distended MUSCULOSKELETAL:  No edema; No deformity  SKIN: Warm and dry LOWER EXTREMITIES: no swelling NEUROLOGIC:  Alert and oriented x 3 PSYCHIATRIC:  Normal affect   ASSESSMENT:    1. Diabetes mellitus without complication ([place])   2. Atypical chest pain   3. Smoking   4. Dyslipidemia    PLAN:    In order of problems listed above:  Coronary disease only mild disease based on coronary CT angio.  Good quality study.  Continue antiplatelet therapy.  Because of her multiple risk factors I think she can benefit from statin.  I did review her K PN which show me LDL 77 HDL 31 so we dealing with somebody with diabetes smoking already present mild coronary artery disease family.  History of premature coronary artery disease and low HDL I will put her on pravastatin 20 mg daily, fasting lipid profile is TLT 6 weeks. Atypical chest pain: Noted.  Plan as described above Dyslipidemia discussion as described above. Smoking: We had at least 5 minutes talking about how she can quit smoking hopefully should be able to accomplish the goal.   Medication Adjustments/Labs and Tests Ordered: Current medicines are reviewed at length with the patient today.  Concerns  regarding medicines are outlined above.  No orders of the defined types were placed in this encounter.  Medication changes: No orders of the defined types were placed in this encounter.   Signed, Park Liter, MD, [facility] 03/27/2022 1:42 PM    [place]

## 2022-03-27 NOTE — Patient Instructions (Signed)
Medication Instructions:   START: Pravastatin 20mg  1 tablet daily   Lab Work: Your physician recommends that you return for lab work in: 6 weeks You need to have labs done when you are fasting.  You can come Monday through Friday 8:30 am to 12:00 pm and 1:15 to 4:30. You do not need to make an appointment as the order has already been placed. The labs you are going to have done are AST, ALT, Lipids.    Testing/Procedures: None Ordered   Follow-Up: At [facility], you and your health needs are our priority.  As part of our continuing mission to provide you with exceptional heart care, we have created designated Provider Care Teams.  These Care Teams include your primary Cardiologist (physician) and Advanced Practice Providers (APPs -  Physician Assistants and Nurse Practitioners) who all work together to provide you with the care you need, when you need it.  We recommend signing up for the patient portal called "MyChart".  Sign up information is provided on this After Visit Summary.  MyChart is used to connect with patients for Virtual Visits (Telemedicine).  Patients are able to view lab/test results, encounter notes, upcoming appointments, etc.  Non-urgent messages can be sent to your provider as well.   To learn more about what you can do with MyChart, go to [email or web address].    Your next appointment:   6 month(s)  The format for your next appointment:   In Person  Provider:   Jenne Campus, MD    Other Instructions NA

## 2022-03-27 NOTE — Addendum Note (Signed)
Addended by: Jacobo Forest D on: 03/27/2022 01:50 PM   Modules accepted: Orders

## 2022-03-27 NOTE — Telephone Encounter (Signed)
Cassandra Marsh from [facility] pt financial services called in asking if someone from pre auth team can give her a call about pt's CT auth.

## 2022-05-15 ENCOUNTER — Encounter: Payer: Self-pay | Admitting: Cardiology

## 2022-12-12 ENCOUNTER — Ambulatory Visit: Payer: BLUE CROSS/BLUE SHIELD | Attending: Cardiology | Admitting: Cardiology

## 2022-12-12 VITALS — BP 128/72 | HR 82 | Ht 62.0 in | Wt 149.2 lb

## 2022-12-12 DIAGNOSIS — K219 Gastro-esophageal reflux disease without esophagitis: Secondary | ICD-10-CM | POA: Diagnosis not present

## 2022-12-12 DIAGNOSIS — IMO0001 Reserved for inherently not codable concepts without codable children: Secondary | ICD-10-CM

## 2022-12-12 DIAGNOSIS — E119 Type 2 diabetes mellitus without complications: Secondary | ICD-10-CM | POA: Diagnosis not present

## 2022-12-12 DIAGNOSIS — E785 Hyperlipidemia, unspecified: Secondary | ICD-10-CM

## 2022-12-12 DIAGNOSIS — F17200 Nicotine dependence, unspecified, uncomplicated: Secondary | ICD-10-CM

## 2022-12-12 DIAGNOSIS — I2510 Atherosclerotic heart disease of native coronary artery without angina pectoris: Secondary | ICD-10-CM

## 2022-12-12 NOTE — Patient Instructions (Signed)
Medication Instructions:  Your physician recommends that you continue on your current medications as directed. Please refer to the Current Medication list given to you today.  *If you need a refill on your cardiac medications before your next appointment, please call your pharmacy*   Lab Work: Lipid panel, AST, ALT, Hgb A1C- today If you have labs (blood work) drawn today and your tests are completely normal, you will receive your results only by: MyChart Message (if you have MyChart) OR A paper copy in the mail If you have any lab test that is abnormal or we need to change your treatment, we will call you to review the results.   Testing/Procedures: None Ordered   Follow-Up: At [facility], you and your health needs are our priority.  As part of our continuing mission to provide you with exceptional heart care, we have created designated Provider Care Teams.  These Care Teams include your primary Cardiologist (physician) and Advanced Practice Providers (APPs -  Physician Assistants and Nurse Practitioners) who all work together to provide you with the care you need, when you need it.  We recommend signing up for the patient portal called "MyChart".  Sign up information is provided on this After Visit Summary.  MyChart is used to connect with patients for Virtual Visits (Telemedicine).  Patients are able to view lab/test results, encounter notes, upcoming appointments, etc.  Non-urgent messages can be sent to your provider as well.   To learn more about what you can do with MyChart, go to [email or web address].    Your next appointment:   6 month(s)  The format for your next appointment:   In Person  Provider:   Gypsy Balsam, MD    Other Instructions NA

## 2022-12-12 NOTE — Addendum Note (Signed)
Addended by: Baldo Ash D on: 12/12/2022 02:23 PM   Modules accepted: Orders

## 2022-12-12 NOTE — Progress Notes (Signed)
Cardiology Office Note:    Date:  12/12/2022   ID:  Cassandra Marsh, DOB 1985/09/02, MRN [id number]  PCP:  [facility]  Cardiologist:  Gypsy Balsam, MD    Referring MD: [facility]*   No chief complaint on file.   History of Present Illness:    Cassandra Marsh is a 37 y.o. female  past medical history significant for diabetes, dyslipidemia, smoking, family history of premature coronary artery disease, she presented to me with atypical symptoms. Coronary CT angio has been performed which showed mild disease involving mid LAD between 25 and 49% stenosis.  Comes today to months for follow-up.  Overall he she is doing fair.  She still complain of having chest pain almost all the time if he wakes up with a heavy sensation.  She goes to sleep with a heavy sensation chest has been going on for years.  She cut down significant amount of cigarettes she smokes she only smokes half pack per day.  Past Medical History:  Diagnosis Date   Abnormal glucose tolerance test (GTT) during pregnancy, antepartum 09/28/2017   Follow up 2 hour postpartum on 02/26/18   Anxiety    Chest pressure    Diabetes mellitus without complication (HCC)    Fatigue    GERD (gastroesophageal reflux disease)    Hypertension    IUD (intrauterine device) in place 02/26/2018   Mental disorder     Past Surgical History:  Procedure Laterality Date   TONSILLECTOMY  2004    Current Medications: Current Meds  Medication Sig   acetaminophen (TYLENOL) 500 MG tablet Take 1 tablet (500 mg total) by mouth every 6 (six) hours as needed.   ARIPiprazole (ABILIFY) 10 MG tablet Take 10 mg by mouth daily.   aspirin EC 81 MG tablet Take 1 tablet (81 mg total) by mouth daily. Swallow whole.   cyclobenzaprine (FLEXERIL) 5 MG tablet Take 5 mg by mouth at bedtime as needed for muscle pain.   escitalopram (LEXAPRO) 10 MG tablet Take 1 tablet (10 mg total) by mouth daily.   gabapentin (NEURONTIN) 300 MG  capsule Take 300 mg by mouth 4 (four) times daily.   lamoTRIgine (LAMICTAL) 200 MG tablet Take 400 mg by mouth 2 (two) times daily.   naproxen (NAPROSYN) 500 MG tablet Take 500 mg by mouth 2 (two) times daily as needed for pain.   norethindrone (MICRONOR) 0.35 MG tablet Take 1 tablet by mouth daily.   ondansetron (ZOFRAN) 4 MG tablet Take 1 tablet (4 mg total) by mouth every 8 (eight) hours as needed for nausea or vomiting.   pravastatin (PRAVACHOL) 20 MG tablet Take 1 tablet (20 mg total) by mouth every evening.   traZODone (DESYREL) 150 MG tablet Take 150 mg by mouth at bedtime.   [DISCONTINUED] ARIPiprazole (ABILIFY) 5 MG tablet Take 5 mg by mouth daily.   [DISCONTINUED] metoprolol tartrate (LOPRESSOR) 50 MG tablet Take one tablet 2 hours before cardiac CT for heart greater than 55     Allergies:   Patient has no known allergies.   Social History   Socioeconomic History   Marital status: Legally Separated    Spouse name: Not on file   Number of children: Not on file   Years of education: Not on file   Highest education level: Not on file  Occupational History   Not on file  Tobacco Use   Smoking status: Every Day    Current packs/day: 0.50    Types: Cigarettes  Smokeless tobacco: Never  Vaping Use   Vaping status: Never Used  Substance and Sexual Activity   Alcohol use: Yes    Comment: occasional glass of wine throughout pregnancy   Drug use: Yes    Types: Marijuana    Comment: occasional use throughout pregnancy   Sexual activity: Yes    Partners: Male    Birth control/protection: I.U.D.  Other Topics Concern   Not on file  Social History Narrative   Not on file   Social Determinants of Health   Financial Resource Strain: Not on file  Food Insecurity: Not on file  Transportation Needs: Not on file  Physical Activity: Not on file  Stress: Not on file  Social Connections: Not on file     Family History: The patient's family history includes Heart attack in  her maternal grandfather and mother. ROS:   Please see the history of present illness.    All 14 point review of systems negative except as described per history of present illness  EKGs/Labs/Other Studies Reviewed:         Recent Labs: 01/16/2022: ALT 21  Recent Lipid Panel    Component Value Date/Time   CHOL 126 01/16/2022 1640   TRIG 91 01/16/2022 1640   HDL 31 (L) 01/16/2022 1640   CHOLHDL 4.1 01/16/2022 1640   LDLCALC 77 01/16/2022 1640    Physical Exam:    VS:  BP 128/72   Pulse 82   Ht 5\' 2"  (1.575 m)   Wt 149 lb 3.2 oz (67.7 kg)   SpO2 97%   BMI 27.29 kg/m     Wt Readings from Last 3 Encounters:  12/12/22 149 lb 3.2 oz (67.7 kg)  03/27/22 139 lb (63 kg)  01/16/22 138 lb (62.6 kg)     GEN:  Well nourished, well developed in no acute distress HEENT: Normal NECK: No JVD; No carotid bruits LYMPHATICS: No lymphadenopathy CARDIAC: RRR, no murmurs, no rubs, no gallops RESPIRATORY:  Clear to auscultation without rales, wheezing or rhonchi  ABDOMEN: Soft, non-tender, non-distended MUSCULOSKELETAL:  No edema; No deformity  SKIN: Warm and dry LOWER EXTREMITIES: no swelling NEUROLOGIC:  Alert and oriented x 3 PSYCHIATRIC:  Normal affect   ASSESSMENT:    1. Coronary artery disease involving native coronary artery of native heart without angina pectoris   2. Gastroesophageal reflux disease, unspecified whether esophagitis present   3. Diabetes mellitus without complication (HCC)   4. Dyslipidemia   5. Smoking    PLAN:    In order of problems listed above:  Coronary disease stable from that point review on appropriate guideline directed medical therapy which I will continue. Gastroesophageal reflux disease which could be responsible for his symptomatology. Dyslipidemia I do have her fasting lipid profile from January with LDL 77 HDL 31 she is only on pravastatin 20.  I will recheck her fasting lipid profile anticipate need to increase dose of  pravastatin. Diabetes mellitus she got no medication for it.  I will check her hemoglobin A1c today. Smoking we had a long discussion strongly recommended to quit and I think she will be able to accomplish that.   Medication Adjustments/Labs and Tests Ordered: Current medicines are reviewed at length with the patient today.  Concerns regarding medicines are outlined above.  No orders of the defined types were placed in this encounter.  Medication changes: No orders of the defined types were placed in this encounter.   Signed, Georgeanna Lea, MD, [facility] 12/12/2022 2:15 PM  [facility]

## 2022-12-13 LAB — AST: AST: 20 [IU]/L (ref 0–40)

## 2022-12-13 LAB — LIPID PANEL
Chol/HDL Ratio: 5.2 {ratio} — ABNORMAL HIGH (ref 0.0–4.4)
Cholesterol, Total: 212 mg/dL — ABNORMAL HIGH (ref 100–199)
HDL: 41 mg/dL (ref >39)
LDL Chol Calc (NIH): 148 mg/dL — ABNORMAL HIGH (ref 0–99)
Triglycerides: 125 mg/dL (ref 0–149)
VLDL Cholesterol Cal: 23 mg/dL (ref 5–40)

## 2022-12-13 LAB — ALT: ALT: 16 [IU]/L (ref 0–32)

## 2022-12-13 LAB — HEMOGLOBIN A1C
Est. average glucose Bld gHb Est-mCnc: 126 mg/dL
Hgb A1c MFr Bld: 6 % — ABNORMAL HIGH (ref 4.8–5.6)

## 2022-12-19 ENCOUNTER — Telehealth: Payer: Self-pay

## 2022-12-19 NOTE — Telephone Encounter (Signed)
Opened in error - see same date note

## 2024-01-16 ENCOUNTER — Encounter (HOSPITAL_BASED_OUTPATIENT_CLINIC_OR_DEPARTMENT_OTHER): Payer: Self-pay | Admitting: Pulmonary Disease

## 2024-01-16 DIAGNOSIS — R0683 Snoring: Secondary | ICD-10-CM
